# Patient Record
Sex: Female | Born: 1970 | Race: White | Hispanic: No | Marital: Married | Smoking: Former smoker
Health system: Southern US, Community
[De-identification: ages and names within clinical notes are randomized; demographics above are authoritative.]

## PROBLEM LIST (undated history)

## (undated) DIAGNOSIS — D649 Anemia, unspecified: Secondary | ICD-10-CM

## (undated) DIAGNOSIS — I10 Essential (primary) hypertension: Secondary | ICD-10-CM

## (undated) HISTORY — PX: COLONOSCOPY: SHX174

---

## 1999-07-15 ENCOUNTER — Emergency Department (HOSPITAL_COMMUNITY): Admission: EM | Admit: 1999-07-15 | Discharge: 1999-07-15 | Payer: Self-pay | Admitting: Emergency Medicine

## 1999-07-15 ENCOUNTER — Encounter: Payer: Self-pay | Admitting: Emergency Medicine

## 2001-06-10 ENCOUNTER — Inpatient Hospital Stay (HOSPITAL_COMMUNITY): Admission: AD | Admit: 2001-06-10 | Discharge: 2001-06-16 | Payer: Self-pay | Admitting: *Deleted

## 2001-06-13 ENCOUNTER — Encounter: Payer: Self-pay | Admitting: *Deleted

## 2001-06-17 ENCOUNTER — Encounter: Admission: RE | Admit: 2001-06-17 | Discharge: 2001-07-17 | Payer: Self-pay | Admitting: *Deleted

## 2001-07-18 ENCOUNTER — Encounter: Admission: RE | Admit: 2001-07-18 | Discharge: 2001-08-17 | Payer: Self-pay | Admitting: *Deleted

## 2004-06-09 ENCOUNTER — Ambulatory Visit: Payer: Self-pay | Admitting: Gastroenterology

## 2006-06-03 ENCOUNTER — Ambulatory Visit: Payer: Self-pay

## 2006-07-15 ENCOUNTER — Ambulatory Visit: Payer: Self-pay | Admitting: Internal Medicine

## 2010-07-28 ENCOUNTER — Ambulatory Visit: Payer: Self-pay | Admitting: Obstetrics and Gynecology

## 2011-09-20 ENCOUNTER — Encounter: Payer: Self-pay | Admitting: Maternal & Fetal Medicine

## 2012-01-12 ENCOUNTER — Observation Stay: Payer: Self-pay | Admitting: Obstetrics and Gynecology

## 2012-01-12 LAB — URINALYSIS, COMPLETE
Bacteria: NONE SEEN
Bilirubin,UR: NEGATIVE
Blood: NEGATIVE
Glucose,UR: NEGATIVE mg/dL (ref 0–75)
Ketone: NEGATIVE
Leukocyte Esterase: NEGATIVE
Nitrite: NEGATIVE
Ph: 8 (ref 4.5–8.0)
Protein: NEGATIVE
RBC,UR: NONE SEEN /HPF (ref 0–5)
Specific Gravity: 1.002 (ref 1.003–1.030)
Squamous Epithelial: <1
WBC UR: NONE SEEN /HPF (ref 0–5)

## 2012-02-03 ENCOUNTER — Inpatient Hospital Stay: Payer: Self-pay | Admitting: Obstetrics and Gynecology

## 2012-02-03 LAB — URINALYSIS, COMPLETE
Bacteria: NONE SEEN
Bilirubin,UR: NEGATIVE
Blood: NEGATIVE
Glucose,UR: NEGATIVE mg/dL (ref 0–75)
Ketone: NEGATIVE
Nitrite: NEGATIVE
Ph: 9 (ref 4.5–8.0)
Protein: NEGATIVE
RBC,UR: NONE SEEN /HPF (ref 0–5)
Specific Gravity: 1.006 (ref 1.003–1.030)
Squamous Epithelial: 5
WBC UR: NONE SEEN /HPF (ref 0–5)

## 2012-02-03 LAB — FETAL FIBRONECTIN
Appearance: NORMAL
Fetal Fibronectin: POSITIVE

## 2012-02-03 LAB — CBC WITH DIFFERENTIAL/PLATELET
Basophil #: 0 10*3/uL (ref 0.0–0.1)
Basophil %: 0.3 %
Eosinophil #: 0.1 10*3/uL (ref 0.0–0.7)
Eosinophil %: 0.6 %
HCT: 31.9 % — ABNORMAL LOW (ref 35.0–47.0)
HGB: 10.7 g/dL — ABNORMAL LOW (ref 12.0–16.0)
Lymphocyte #: 1.1 10*3/uL (ref 1.0–3.6)
Lymphocyte %: 8.9 %
MCH: 32.3 pg (ref 26.0–34.0)
MCHC: 33.7 g/dL (ref 32.0–36.0)
MCV: 96 fL (ref 80–100)
Monocyte #: 0.2 x10 3/mm (ref 0.2–0.9)
Monocyte %: 1.6 %
Neutrophil #: 10.8 10*3/uL — ABNORMAL HIGH (ref 1.4–6.5)
Neutrophil %: 88.6 %
Platelet: 252 10*3/uL (ref 150–440)
RBC: 3.32 10*6/uL — ABNORMAL LOW (ref 3.80–5.20)
RDW: 14 % (ref 11.5–14.5)
WBC: 12.2 10*3/uL — ABNORMAL HIGH (ref 3.6–11.0)

## 2012-02-04 LAB — URINE CULTURE

## 2012-02-05 LAB — LIPASE, BLOOD: Lipase: 77 U/L (ref 73–393)

## 2012-02-05 LAB — AMYLASE: Amylase: 41 U/L (ref 25–115)

## 2012-02-23 ENCOUNTER — Observation Stay: Payer: Self-pay | Admitting: Obstetrics and Gynecology

## 2012-02-23 LAB — AMYLASE: Amylase: 51 U/L (ref 25–115)

## 2012-02-23 LAB — CBC WITH DIFFERENTIAL/PLATELET
Basophil %: 0.6 %
Eosinophil #: 0.1 10*3/uL (ref 0.0–0.7)
Eosinophil %: 1.3 %
HCT: 31.3 % — ABNORMAL LOW (ref 35.0–47.0)
HGB: 10.6 g/dL — ABNORMAL LOW (ref 12.0–16.0)
MCH: 32.3 pg (ref 26.0–34.0)
MCV: 95 fL (ref 80–100)
Monocyte #: 0.8 x10 3/mm (ref 0.2–0.9)
Monocyte %: 7.4 %
Neutrophil #: 7.8 10*3/uL — ABNORMAL HIGH (ref 1.4–6.5)
Platelet: 270 10*3/uL (ref 150–440)
RBC: 3.29 10*6/uL — ABNORMAL LOW (ref 3.80–5.20)
RDW: 13.5 % (ref 11.5–14.5)
WBC: 10.5 10*3/uL (ref 3.6–11.0)

## 2012-02-23 LAB — COMPREHENSIVE METABOLIC PANEL
Albumin: 2.5 g/dL — ABNORMAL LOW (ref 3.4–5.0)
Alkaline Phosphatase: 128 U/L (ref 50–136)
Anion Gap: 9 (ref 7–16)
BUN: 3 mg/dL — ABNORMAL LOW (ref 7–18)
Calcium, Total: 8.3 mg/dL — ABNORMAL LOW (ref 8.5–10.1)
Chloride: 109 mmol/L — ABNORMAL HIGH (ref 98–107)
Co2: 25 mmol/L (ref 21–32)
EGFR (African American): >60
Glucose: 84 mg/dL (ref 65–99)
Osmolality: 281 (ref 275–301)
Potassium: 3.8 mmol/L (ref 3.5–5.1)
SGOT(AST): 19 U/L (ref 15–37)
Sodium: 143 mmol/L (ref 136–145)

## 2012-03-10 ENCOUNTER — Inpatient Hospital Stay: Payer: Self-pay | Admitting: Obstetrics and Gynecology

## 2012-03-10 LAB — CBC WITH DIFFERENTIAL/PLATELET
Basophil %: 0.6 %
Eosinophil %: 0.4 %
HGB: 10.7 g/dL — ABNORMAL LOW (ref 12.0–16.0)
MCH: 32.3 pg (ref 26.0–34.0)
Monocyte #: 0.8 x10 3/mm (ref 0.2–0.9)
Monocyte %: 6.5 %
Neutrophil #: 9.2 10*3/uL — ABNORMAL HIGH (ref 1.4–6.5)
Neutrophil %: 76.7 %
Platelet: 254 10*3/uL (ref 150–440)
RBC: 3.3 10*6/uL — ABNORMAL LOW (ref 3.80–5.20)
RDW: 13.6 % (ref 11.5–14.5)
WBC: 12 10*3/uL — ABNORMAL HIGH (ref 3.6–11.0)

## 2013-08-01 DIAGNOSIS — D239 Other benign neoplasm of skin, unspecified: Secondary | ICD-10-CM

## 2013-08-01 HISTORY — DX: Other benign neoplasm of skin, unspecified: D23.9

## 2014-03-08 ENCOUNTER — Ambulatory Visit: Payer: Self-pay | Admitting: Obstetrics and Gynecology

## 2014-09-10 NOTE — Discharge Summary (Signed)
PATIENT NAME:  Hall, Kathleen MR#:  [id number] DATE OF BIRTH:  03-17-1971  DATE OF ADMISSION:  02/03/2012 DATE OF DISCHARGE:  02/06/2012  REASON FOR ADMISSION: Preterm labor with history of PROM and preterm labor and delivery.   DISCHARGE DIAGNOSIS: Preterm labor with history of P-PROM and preterm labor and delivery.   PROCEDURE: IM betamethasone, IV magnesium sulfate, p.o. Procardia.   HOSPITAL COURSE: Patient was admitted at 30+ weeks with symptoms consistent with previous delivery. She has a history of P-PROM and no evidence of rupture on this admission. On admission she was noted that her cervix did change. She had increasing contractions. She had a positive fetal fibronectin which led [place] to provide IV magnesium sulfate. She was left on IV magnesium sulfate for 48 hours while betamethasone was given. She was transferred to the floor and appears stable and she was discharged home. Please refer to progress notes which were not available to me at this moment to assess plan from that point on.   ____________________________ Ricky L. Amalia Hailey, MD rle:cms D: 02/21/2012 10:40:57 ET T: 02/21/2012 10:52:24 ET JOB#: [id number]  cc: Ricky L. Amalia Hailey, MD, <Dictator> Selmer Dominion MD ELECTRONICALLY SIGNED 02/21/2012 13:22

## 2014-09-10 NOTE — Consult Note (Signed)
Maternal Age 44    Gravida 3    Para 1    Term Deliveries 0    Preterm Deliveries 1    Abortions 1    Living Children 1    Final EDD (dd-mmm-yy) 04-Feb-2012    Blood Type (Maternal) A positive    Antibody Screen Results (Maternal) negative    HIV Results (Maternal) unknown    Gonorrhea Results (Maternal) negative    Chlamydia Results (Maternal) negative    Hepatitis C Culture (Maternal) unknown    Herpes Results (Maternal) n/a    VDRL/RPR/Syphilis Results (Maternal) negative    Varicella Titer Results (Maternal) Unknown    Rubella Results (Maternal) immune    Hepatitis B Surface Antigen Results (Maternal) negative    Group B Strep Results Maternal (Result >5wks must be treated as unknown) unknown/result > 5 weeks ago    Prenatal Care Adequate     Additional Comments I met with Ms. Klemann per Dr.Evans's request for prenatal consult. She is a 76yo G2 mother with uncomplicated pregnancy and prior h/o preterm labor 10 yrs ago. I explained in detail about the length of hospital stay, hospital course and complications of prematurity in case baby is born in the next few days. Mother has received full course prenatal steroids. We discussed about sepsis evaluation, jaundice and feeding preoblems related to prematurity.   She acknowledged understanding and had no futher question.  I offered to come back if she were to have any questions.    Thank you for the consultation.   Electronic Signatures: Darreld Mclean (MD)  (Signed 13-Sep-13 15:41)  Authored: PREGNANCY and LABOR, ADDITIONAL COMMENTS   Last Updated: 13-Sep-13 15:41 by Darreld Mclean (MD)

## 2014-09-10 NOTE — Op Note (Signed)
PATIENT NAME:  Kathleen Hall, Kathleen Hall MR#:  [id number] DATE OF BIRTH:  02/06/1971  DATE OF PROCEDURE:  03/11/2012  PREOPERATIVE DIAGNOSES:  1. Prolonged second stage of labor. 2. Occiput transverse presentation.   POSTOPERATIVE DIAGNOSES: 1. Prolonged second stage of labor. 2. Occiput transverse presentation.   PROCEDURES:  1. Kjelland rotation forceps and delivery. 2. Repair of partial third degree perineal laceration.   INDICATIONS: This is a 44 year old gravida 2 para 1 patient labored and was pushing for three hours and was not bringing the fetal head past +2 stations. Examination revealed fetus to be in the right occiput transverse presentation. This was confirmed with a bedside ultrasound that documented the ROT presentation with the spine on the patient's right side. The patient was set up for a forcep delivery with Kjelland forceps. The indication and the procedure was explained to the patient and the husband who verbally agreed.  PROCEDURE: The Kjelland anterior blade was placed in the classic presentation underneath the symphysis pubis with good application. The posterior blade was then placed without difficulty. The forcep blades articulated without difficulty. Position of the blades reconfirmed with the digital exam. The fetal head was then rotated from an ROT position to a ROA position with a 90 degree rotation. Forceps were not disarticulated and remained applied to the fetal head while the patient pushed and the head was delivered in [facility] presentation. Forceps were then removed and the rest of the head was delivered. Shoulders were delivered aided by the patient in the McRoberts leg position and delivery of the shoulder and the rest of the body without difficulty. Vigorous female was then passed to Dr. Ave Filter who assigned Apgar scores of 8 and 9. Forcep markings with some bruising on the left sheath area. No orbital compression noted. Placenta was delivered without difficulty after obtaining  cord blood. A partial third-degree laceration was then noted with a deeper right vaginal posterior laceration noted. Partial third-degree capsule reapproximated with 2-0 Vicryl suture. The vaginal epithelium was closed with 2-0 Vicryl and crown suture placed with reapproximation of perineal body and the skin was reapproximated with subcuticular 3-0 Vicryl suture. Some swelling was noted. Rectal exam was performed before repair and after which both were normal. The patient tolerated the procedure well. Estimated blood loss 300 mL.   ____________________________ Boykin Nearing, MD tjs:drc D: 03/11/2012 10:24:20 ET T: 03/11/2012 11:12:39 ET JOB#: [id number]  cc: Boykin Nearing, MD, <Dictator> Boykin Nearing MD ELECTRONICALLY SIGNED 03/12/2012 17:53

## 2015-03-13 ENCOUNTER — Other Ambulatory Visit: Payer: Self-pay | Admitting: Obstetrics and Gynecology

## 2015-03-13 DIAGNOSIS — Z1231 Encounter for screening mammogram for malignant neoplasm of breast: Secondary | ICD-10-CM

## 2015-04-01 ENCOUNTER — Ambulatory Visit
Admission: RE | Admit: 2015-04-01 | Discharge: 2015-04-01 | Disposition: A | Discharge status: Home / Self Care | Payer: BLUE CROSS/BLUE SHIELD | Source: Ambulatory Visit | Attending: Obstetrics and Gynecology | Admitting: Obstetrics and Gynecology

## 2015-04-01 DIAGNOSIS — Z1231 Encounter for screening mammogram for malignant neoplasm of breast: Secondary | ICD-10-CM | POA: Insufficient documentation

## 2015-12-26 ENCOUNTER — Encounter: Payer: Self-pay | Admitting: Emergency Medicine

## 2015-12-26 ENCOUNTER — Emergency Department: Payer: BLUE CROSS/BLUE SHIELD

## 2015-12-26 ENCOUNTER — Emergency Department
Admission: EM | Admit: 2015-12-26 | Discharge: 2015-12-27 | Disposition: A | Discharge status: Home / Self Care | Payer: BLUE CROSS/BLUE SHIELD | Attending: Emergency Medicine | Admitting: Emergency Medicine

## 2015-12-26 DIAGNOSIS — Z87891 Personal history of nicotine dependence: Secondary | ICD-10-CM | POA: Insufficient documentation

## 2015-12-26 DIAGNOSIS — I10 Essential (primary) hypertension: Secondary | ICD-10-CM | POA: Diagnosis not present

## 2015-12-26 DIAGNOSIS — Z79899 Other long term (current) drug therapy: Secondary | ICD-10-CM | POA: Insufficient documentation

## 2015-12-26 DIAGNOSIS — R51 Headache: Secondary | ICD-10-CM | POA: Diagnosis present

## 2015-12-26 DIAGNOSIS — R079 Chest pain, unspecified: Secondary | ICD-10-CM

## 2015-12-26 DIAGNOSIS — R519 Headache, unspecified: Secondary | ICD-10-CM

## 2015-12-26 LAB — BASIC METABOLIC PANEL
Anion gap: 7 (ref 5–15)
BUN: 10 mg/dL (ref 6–20)
CHLORIDE: 105 mmol/L (ref 101–111)
CO2: 25 mmol/L (ref 22–32)
Calcium: 9.2 mg/dL (ref 8.9–10.3)
Creatinine, Ser: 0.77 mg/dL (ref 0.44–1.00)
GFR calc Af Amer: >60 mL/min (ref >60)
GFR calc non Af Amer: >60 mL/min (ref >60)
GLUCOSE: 110 mg/dL — AB (ref 65–99)
POTASSIUM: 4.2 mmol/L (ref 3.5–5.1)
SODIUM: 137 mmol/L (ref 135–145)

## 2015-12-26 LAB — CBC
HEMATOCRIT: 40.2 % (ref 35.0–47.0)
Hemoglobin: 13.6 g/dL (ref 12.0–16.0)
MCH: 32.5 pg (ref 26.0–34.0)
MCHC: 33.9 g/dL (ref 32.0–36.0)
MCV: 96.1 fL (ref 80.0–100.0)
Platelets: 276 10*3/uL (ref 150–440)
RBC: 4.19 MIL/uL (ref 3.80–5.20)
RDW: 13.1 % (ref 11.5–14.5)
WBC: 7.8 10*3/uL (ref 3.6–11.0)

## 2015-12-26 LAB — TROPONIN I: Troponin I: <0.03 ng/mL (ref <0.03)

## 2015-12-26 MED ORDER — ASPIRIN 81 MG PO CHEW
324.0000 mg | CHEWABLE_TABLET | Freq: Once | ORAL | Status: AC
  Administered 2015-12-26: 324 mg via ORAL
  Filled 2015-12-26: qty 4

## 2015-12-26 MED ORDER — NITROGLYCERIN 0.4 MG SL SUBL: 0.4000 mg | SUBLINGUAL_TABLET | SUBLINGUAL | Status: DC | PRN

## 2015-12-26 NOTE — ED Notes (Signed)
Patient states that her chest hurts more when she exhales rather than inspiration.  Patient appears in NAD at this time with even and unlabored respirations.

## 2015-12-26 NOTE — ED Provider Notes (Signed)
[facility] Emergency Department Provider Note   ____________________________________________   First MD Initiated Contact with Patient 12/26/15 2311     (approximate)  I have reviewed the triage vital signs and the nursing notes.   HISTORY  Chief Complaint Chest Pain    HPI Kathleen Hall is a 45 y.o. female who comes into the hospital today with some chest pain and elevated blood pressure. The patient reports that her chest felt tight in her arm was tingling. The patient reports that she was driving to the grocery store when the symptoms started. The patient reports that she went to [facility] and checked her blood pressure. She reports that it was 194/104 and then 184/98. The patient reports that she does not have a history of elevated blood pressure. The pharmacist told her that she should come in and get checked out. The patient denies any shortness of breath, sweats, dizziness, lightheadedness. She reports that her chest pain is improved and currently is a 2 out of 10 in intensity. She denies any nausea or vomiting. She reports that she does have a headache currently but has been having headaches for the past 2 weeks. The patient rates her head pain of 4-5 out of 10 in intensity. She does have a history of migraines but reports that she hasn't had one in a few years and that this doesn't feel like her migraines. Dull and achy and resolved with Advil and ibuprofen.The patient is here for evaluation.   History reviewed. No pertinent past medical history.  There are no active problems to display for this patient.   History reviewed. No pertinent surgical history.  Prior to Admission medications   Medication Sig Start Date End Date Taking? Authorizing Provider  levonorgestrel-ethinyl estradiol (SEASONALE,INTROVALE,JOLESSA) 0.15-0.03 MG tablet Take 1 tablet by mouth daily. 10/19/15  Yes Historical Provider, MD  polyethylene glycol (MIRALAX / GLYCOLAX) packet Take  17 g by mouth daily.   Yes Historical Provider, MD    Allergies Review of patient's allergies indicates no known allergies.  Family History  Problem Relation Age of Onset  . Breast cancer Neg Hx     Social History Social History  Substance Use Topics  . Smoking status: Former Research scientist (life sciences)  . Smokeless tobacco: Never Used  . Alcohol use Yes    Review of Systems Constitutional: No fever/chills Eyes: No visual changes. ENT: No sore throat. Cardiovascular:  chest pain. Respiratory: Denies shortness of breath. Gastrointestinal: No abdominal pain.  No nausea, no vomiting.  No diarrhea.  No constipation. Genitourinary: Negative for dysuria. Musculoskeletal: Negative for back pain. Skin: Negative for rash. Neurological: Left arm tingling and headache  10-point ROS otherwise negative.  ____________________________________________   PHYSICAL EXAM:  VITAL SIGNS: ED Triage Vitals [12/26/15 1948]  Enc Vitals Group     BP (!) 188/96     Pulse Rate 85     Resp 16     Temp 97.8 F (36.6 C)     Temp Source Oral     SpO2 100 %     Weight 155 lb (70.3 kg)     Height 5\' 1"  (1.549 m)     Head Circumference      Peak Flow      Pain Score 5     Pain Loc      Pain Edu?      Excl. in [place]?     Constitutional: Alert and oriented. Well appearing and in Mild distress. Eyes: Conjunctivae are normal. PERRL.  EOMI. Head: Atraumatic. Nose: No congestion/rhinnorhea. Mouth/Throat: Mucous membranes are moist.  Oropharynx non-erythematous. Cardiovascular: Normal rate, regular rhythm. Grossly normal heart sounds.  Good peripheral circulation. Respiratory: Normal respiratory effort.  No retractions. Lungs CTAB. Gastrointestinal: Soft and nontender. No distention. Positive bowel sounds Musculoskeletal: No lower extremity tenderness nor edema.   Neurologic:  Normal speech and language.  Skin:  Skin is warm, dry and intact.  Psychiatric: Mood and affect are normal.    ____________________________________________   LABS (all labs ordered are listed, but only abnormal results are displayed)  Labs Reviewed  BASIC METABOLIC PANEL - Abnormal; Notable for the following:       Result Value   Glucose, Bld 110 (*)    All other components within normal limits  CBC  TROPONIN I  TROPONIN I   ____________________________________________  EKG  ED ECG REPORT I, Loney Hering, the attending physician, personally viewed and interpreted this ECG.   Date: 12/26/2015  EKG Time: 1956  Rate: 86  Rhythm: normal sinus rhythm  Axis: Normal  Intervals:none  ST&T Change: None  ____________________________________________  RADIOLOGY  Chest x-ray ____________________________________________   PROCEDURES  Procedure(s) performed: None  Procedures  Critical Care performed: No  ____________________________________________   INITIAL IMPRESSION / ASSESSMENT AND PLAN / ED COURSE  Pertinent labs & imaging results that were available during my care of the patient were reviewed by me and considered in my medical decision making (see chart for details).  This is a 45 year old female who comes into the hospital today with some chest pain and arm tingling. The patient's blood pressure was elevated prior to coming into the emergency department. Her blood pressures improved at this time I will give her some aspirin as well as a dose of nitroglycerin. I will then check a repeat troponin. I will also check a CT of the patient's head given her chronic headaches for the past 2 weeks. The patient will be reassessed once I have received a repeat blood work as well as the imaging studies.  Clinical Course  Value Comment By Time  DG Chest 2 View The patient's chest x-ray shows no acute pulmonary process. Loney Hering, MD 08/04 2326  CT Head Wo Contrast Normal noncontrast head CT. Loney Hering, MD 08/05 [phone]   The patient's CT head is unremarkable and  her troponin is unremarkable. At this time I feel that the patient's pain may have been due to her blood pressure. She does not have a primary care physician so it is unknown if the patient has been having difficulty with her blood pressure. The patient's blood pressure currently though is improved. As the patient's troponin is negative I will discharge the patient to have her follow-up with the acute care clinic for further evaluation of her chest pain as well as her elevated blood pressure. Loney Hering, MD 08/05 [phone]   The patient will be discharged to home to follow-up with the acute care clinic. The patient understands and agrees with the plans as stated previously.  ____________________________________________   FINAL CLINICAL IMPRESSION(S) / ED DIAGNOSES  Final diagnoses:  Essential hypertension  Chest pain, unspecified chest pain type  Acute nonintractable headache, unspecified headache type      NEW MEDICATIONS STARTED DURING THIS VISIT:  New Prescriptions   No medications on file     Note:  This document was prepared using Dragon voice recognition software and may include unintentional dictation errors.    Loney Hering, MD 12/27/15 [place]

## 2015-12-26 NOTE — ED Triage Notes (Signed)
  Pt comes into the ED via POV c/o central and left sided chest pain that radiates into the left Arm.  Patient describes it as tightness and tingling sensation into the arm.  Patient also has elevated BP which is new for her.  Denies any N/V, diaphoresis, or lightheadedness.  Patient attempted to take Tums to make sur eit wasn't heart burn and she has had no relief.

## 2015-12-27 LAB — TROPONIN I: Troponin I: <0.03 ng/mL (ref <0.03)

## 2016-01-19 ENCOUNTER — Ambulatory Visit: Payer: BLUE CROSS/BLUE SHIELD | Admitting: Family Medicine

## 2016-01-19 DIAGNOSIS — Z0289 Encounter for other administrative examinations: Secondary | ICD-10-CM

## 2017-02-24 ENCOUNTER — Other Ambulatory Visit: Payer: Self-pay | Admitting: Physician Assistant

## 2017-02-24 DIAGNOSIS — Z1239 Encounter for other screening for malignant neoplasm of breast: Secondary | ICD-10-CM

## 2017-02-24 DIAGNOSIS — Z0000 Encounter for general adult medical examination without abnormal findings: Secondary | ICD-10-CM

## 2017-03-31 ENCOUNTER — Other Ambulatory Visit: Payer: Self-pay | Admitting: Physician Assistant

## 2017-03-31 DIAGNOSIS — M545 Low back pain: Secondary | ICD-10-CM

## 2017-04-27 ENCOUNTER — Ambulatory Visit: Payer: BLUE CROSS/BLUE SHIELD

## 2017-04-28 ENCOUNTER — Other Ambulatory Visit: Payer: Self-pay

## 2017-04-28 ENCOUNTER — Encounter
Admission: RE | Admit: 2017-04-28 | Discharge: 2017-04-28 | Disposition: A | Discharge status: Home / Self Care | Payer: BLUE CROSS/BLUE SHIELD | Source: Ambulatory Visit | Attending: Obstetrics & Gynecology | Admitting: Obstetrics & Gynecology

## 2017-04-28 HISTORY — DX: Essential (primary) hypertension: I10

## 2017-04-28 HISTORY — DX: Anemia, unspecified: D64.9

## 2017-05-02 ENCOUNTER — Encounter
Admission: RE | Admit: 2017-05-02 | Discharge: 2017-05-02 | Disposition: A | Discharge status: Home / Self Care | Payer: BLUE CROSS/BLUE SHIELD | Source: Ambulatory Visit | Attending: Obstetrics & Gynecology | Admitting: Obstetrics & Gynecology

## 2017-05-02 DIAGNOSIS — Z01818 Encounter for other preprocedural examination: Secondary | ICD-10-CM | POA: Insufficient documentation

## 2017-05-02 DIAGNOSIS — N83202 Unspecified ovarian cyst, left side: Secondary | ICD-10-CM | POA: Insufficient documentation

## 2017-05-02 DIAGNOSIS — I10 Essential (primary) hypertension: Secondary | ICD-10-CM | POA: Insufficient documentation

## 2017-05-02 DIAGNOSIS — R102 Pelvic and perineal pain: Secondary | ICD-10-CM | POA: Diagnosis not present

## 2017-05-02 LAB — CBC
HCT: 41.1 % (ref 35.0–47.0)
Hemoglobin: 13.9 g/dL (ref 12.0–16.0)
MCH: 32.4 pg (ref 26.0–34.0)
MCHC: 33.8 g/dL (ref 32.0–36.0)
MCV: 95.8 fL (ref 80.0–100.0)
PLATELETS: 239 10*3/uL (ref 150–440)
RBC: 4.29 MIL/uL (ref 3.80–5.20)
RDW: 12.6 % (ref 11.5–14.5)
WBC: 5.3 10*3/uL (ref 3.6–11.0)

## 2017-05-02 LAB — BASIC METABOLIC PANEL
ANION GAP: 10 (ref 5–15)
BUN: 17 mg/dL (ref 6–20)
CO2: 29 mmol/L (ref 22–32)
Calcium: 9.2 mg/dL (ref 8.9–10.3)
Chloride: 101 mmol/L (ref 101–111)
Creatinine, Ser: 0.86 mg/dL (ref 0.44–1.00)
Glucose, Bld: 91 mg/dL (ref 65–99)
Potassium: 3.5 mmol/L (ref 3.5–5.1)
Sodium: 140 mmol/L (ref 135–145)

## 2017-05-02 NOTE — Patient Instructions (Signed)
Your procedure is scheduled on: 05/06/17 Report to [facility]. To find out your arrival time please call [phone] between 1PM - 3PM on 05/05/17 Remember: Instructions that are not followed completely may result in serious medical risk, up to and including death, or upon the discretion of your surgeon and anesthesiologist your surgery may need to be rescheduled.     _X__ 1. Do not eat food after midnight the night before your procedure.                 No gum chewing or hard candies. You may drink clear liquids up to 2 hours                 before you are scheduled to arrive for your surgery- DO not drink clear                 liquids within 2 hours of the start of your surgery.                 Clear Liquids include:  water, apple juice without pulp, clear carbohydrate                 drink such as Clearfast of Gartorade, Black Coffee or Tea (Do not add                 anything to coffee or tea).     _X__ 2.  No Alcohol for 24 hours before or after surgery.   _X__ 3.  Do Not Smoke or use e-cigarettes For 24 Hours Prior to Your Surgery.                 Do not use any chewable tobacco products for at least 6 hours prior to                 surgery.  ____  4.  Bring all medications with you on the day of surgery if instructed.   ____  5.  Notify your doctor if there is any change in your medical condition      (cold, fever, infections).     Do not wear jewelry, make-up, hairpins, clips or nail polish. Do not wear lotions, powders, or perfumes. You may wear deodorant. Do not shave 48 hours prior to surgery. Men may shave face and neck. Do not bring valuables to the hospital.    [facility] is not responsible for any belongings or valuables.  Contacts, dentures or bridgework may not be worn into surgery. Leave your suitcase in the car. After surgery it may be brought to your room. For patients admitted to the hospital, discharge time is determined by  your treatment team.   Patients discharged the day of surgery will not be allowed to drive home.   Please read over the following fact sheets that you were given:             ____ Take these medicines the morning of surgery with A SIP OF WATER:    1.   2.   3.   4.  5.  6.  ____ Fleet Enema (as directed)   ____ Use CHG Soap as directed  ____ Use inhalers on the day of surgery  ____ Stop metformin 2 days prior to surgery    ____ Take 1/2 of usual insulin dose the night before surgery. No insulin the morning          of surgery.   ____ Stop  Coumadin/Plavix/aspirin on   ____ Stop Anti-inflammatories on   ____ Stop supplements until after surgery.    ____ Bring C-Pap to the hospital.   __X_ Stop Phentermine now, stop ibuprofen now

## 2017-05-06 ENCOUNTER — Ambulatory Visit
Admission: RE | Admit: 2017-05-06 | Discharge: 2017-05-06 | Disposition: A | Discharge status: Home / Self Care | Payer: BLUE CROSS/BLUE SHIELD | Source: Ambulatory Visit | Attending: Obstetrics & Gynecology | Admitting: Obstetrics & Gynecology

## 2017-05-06 ENCOUNTER — Encounter: Payer: Self-pay | Admitting: *Deleted

## 2017-05-06 ENCOUNTER — Ambulatory Visit: Payer: BLUE CROSS/BLUE SHIELD | Admitting: Anesthesiology

## 2017-05-06 ENCOUNTER — Encounter
Admission: RE | Disposition: A | Discharge status: Home / Self Care | Payer: Self-pay | Source: Ambulatory Visit | Attending: Obstetrics & Gynecology

## 2017-05-06 DIAGNOSIS — Z30432 Encounter for removal of intrauterine contraceptive device: Secondary | ICD-10-CM | POA: Diagnosis not present

## 2017-05-06 DIAGNOSIS — N711 Chronic inflammatory disease of uterus: Secondary | ICD-10-CM | POA: Insufficient documentation

## 2017-05-06 DIAGNOSIS — N83202 Unspecified ovarian cyst, left side: Secondary | ICD-10-CM | POA: Insufficient documentation

## 2017-05-06 DIAGNOSIS — N920 Excessive and frequent menstruation with regular cycle: Secondary | ICD-10-CM | POA: Insufficient documentation

## 2017-05-06 DIAGNOSIS — R102 Pelvic and perineal pain: Secondary | ICD-10-CM | POA: Insufficient documentation

## 2017-05-06 DIAGNOSIS — I10 Essential (primary) hypertension: Secondary | ICD-10-CM | POA: Insufficient documentation

## 2017-05-06 DIAGNOSIS — N838 Other noninflammatory disorders of ovary, fallopian tube and broad ligament: Secondary | ICD-10-CM | POA: Insufficient documentation

## 2017-05-06 DIAGNOSIS — N840 Polyp of corpus uteri: Secondary | ICD-10-CM | POA: Insufficient documentation

## 2017-05-06 DIAGNOSIS — N938 Other specified abnormal uterine and vaginal bleeding: Secondary | ICD-10-CM | POA: Insufficient documentation

## 2017-05-06 DIAGNOSIS — Z79899 Other long term (current) drug therapy: Secondary | ICD-10-CM | POA: Insufficient documentation

## 2017-05-06 DIAGNOSIS — Z87891 Personal history of nicotine dependence: Secondary | ICD-10-CM | POA: Diagnosis not present

## 2017-05-06 HISTORY — PX: LAPAROSCOPIC UNILATERAL SALPINGO OOPHERECTOMY: SHX5935

## 2017-05-06 HISTORY — PX: LAPAROSCOPIC BILATERAL SALPINGECTOMY: SHX5889

## 2017-05-06 HISTORY — PX: IUD REMOVAL: SHX5392

## 2017-05-06 HISTORY — PX: HYSTEROSCOPY WITH NOVASURE: SHX5574

## 2017-05-06 LAB — ABO/RH: ABO/RH(D): A POS

## 2017-05-06 LAB — POCT PREGNANCY, URINE: PREG TEST UR: NEGATIVE

## 2017-05-06 SURGERY — SALPINGECTOMY, BILATERAL, LAPAROSCOPIC
Anesthesia: General

## 2017-05-06 MED ORDER — LIDOCAINE HCL (CARDIAC) 20 MG/ML IV SOLN
INTRAVENOUS | Status: DC | PRN
  Administered 2017-05-06: 40 mg via INTRAVENOUS

## 2017-05-06 MED ORDER — SILVER NITRATE-POT NITRATE 75-25 % EX MISC
CUTANEOUS | Status: AC
  Filled 2017-05-06: qty 2

## 2017-05-06 MED ORDER — OXYCODONE HCL 5 MG PO TABS
5.0000 mg | ORAL_TABLET | ORAL | Status: DC | PRN
  Administered 2017-05-06: 5 mg via ORAL

## 2017-05-06 MED ORDER — ONDANSETRON HCL 4 MG/2ML IJ SOLN: 4.0000 mg | Freq: Once | INTRAMUSCULAR | Status: DC | PRN

## 2017-05-06 MED ORDER — PROPOFOL 10 MG/ML IV BOLUS
INTRAVENOUS | Status: DC | PRN
  Administered 2017-05-06: 140 mg via INTRAVENOUS

## 2017-05-06 MED ORDER — HEPARIN SODIUM (PORCINE) 5000 UNIT/ML IJ SOLN
5000.0000 [IU] | Freq: Once | INTRAMUSCULAR | Status: AC
  Administered 2017-05-06: 5000 [IU] via SUBCUTANEOUS

## 2017-05-06 MED ORDER — SUGAMMADEX SODIUM 200 MG/2ML IV SOLN
INTRAVENOUS | Status: AC
  Filled 2017-05-06: qty 2

## 2017-05-06 MED ORDER — KETOROLAC TROMETHAMINE 30 MG/ML IJ SOLN
30.0000 mg | Freq: Four times a day (QID) | INTRAMUSCULAR | Status: DC
  Filled 2017-05-06 (×6): qty 1

## 2017-05-06 MED ORDER — FAMOTIDINE 20 MG PO TABS
20.0000 mg | ORAL_TABLET | Freq: Once | ORAL | Status: AC
  Administered 2017-05-06: 20 mg via ORAL

## 2017-05-06 MED ORDER — ACETAMINOPHEN 500 MG PO TABS
ORAL_TABLET | ORAL | Status: AC
  Administered 2017-05-06: 1000 mg via ORAL
  Filled 2017-05-06: qty 2

## 2017-05-06 MED ORDER — CELECOXIB 200 MG PO CAPS
ORAL_CAPSULE | ORAL | Status: AC
  Administered 2017-05-06: 400 mg via ORAL
  Filled 2017-05-06: qty 2

## 2017-05-06 MED ORDER — PROPOFOL 10 MG/ML IV BOLUS
INTRAVENOUS | Status: AC
  Filled 2017-05-06: qty 20

## 2017-05-06 MED ORDER — ROCURONIUM BROMIDE 100 MG/10ML IV SOLN
INTRAVENOUS | Status: DC | PRN
  Administered 2017-05-06: 40 mg via INTRAVENOUS
  Administered 2017-05-06 (×2): 10 mg via INTRAVENOUS

## 2017-05-06 MED ORDER — MIDAZOLAM HCL 2 MG/2ML IJ SOLN
INTRAMUSCULAR | Status: AC
  Filled 2017-05-06: qty 2

## 2017-05-06 MED ORDER — ONDANSETRON HCL 4 MG/2ML IJ SOLN
INTRAMUSCULAR | Status: DC | PRN
  Administered 2017-05-06: 4 mg via INTRAVENOUS

## 2017-05-06 MED ORDER — LACTATED RINGERS IV SOLN: INTRAVENOUS | Status: DC

## 2017-05-06 MED ORDER — FENTANYL CITRATE (PF) 100 MCG/2ML IJ SOLN
INTRAMUSCULAR | Status: DC | PRN
  Administered 2017-05-06 (×2): 50 ug via INTRAVENOUS

## 2017-05-06 MED ORDER — FENTANYL CITRATE (PF) 100 MCG/2ML IJ SOLN
INTRAMUSCULAR | Status: AC
  Filled 2017-05-06: qty 2

## 2017-05-06 MED ORDER — ROCURONIUM BROMIDE 50 MG/5ML IV SOLN
INTRAVENOUS | Status: AC
  Filled 2017-05-06: qty 1

## 2017-05-06 MED ORDER — DEXAMETHASONE SODIUM PHOSPHATE 10 MG/ML IJ SOLN
INTRAMUSCULAR | Status: DC | PRN
  Administered 2017-05-06: 10 mg via INTRAVENOUS

## 2017-05-06 MED ORDER — ACETAMINOPHEN 500 MG PO TABS
1000.0000 mg | ORAL_TABLET | Freq: Once | ORAL | Status: AC
  Administered 2017-05-06: 1000 mg via ORAL

## 2017-05-06 MED ORDER — DEXAMETHASONE SODIUM PHOSPHATE 10 MG/ML IJ SOLN
INTRAMUSCULAR | Status: AC
  Filled 2017-05-06: qty 1

## 2017-05-06 MED ORDER — FENTANYL CITRATE (PF) 100 MCG/2ML IJ SOLN: 25.0000 ug | INTRAMUSCULAR | Status: DC | PRN

## 2017-05-06 MED ORDER — HEPARIN SODIUM (PORCINE) 5000 UNIT/ML IJ SOLN
INTRAMUSCULAR | Status: AC
  Administered 2017-05-06: 5000 [IU] via SUBCUTANEOUS
  Filled 2017-05-06: qty 1

## 2017-05-06 MED ORDER — MIDAZOLAM HCL 2 MG/2ML IJ SOLN
INTRAMUSCULAR | Status: DC | PRN
  Administered 2017-05-06: 2 mg via INTRAVENOUS

## 2017-05-06 MED ORDER — SILVER NITRATE-POT NITRATE 75-25 % EX MISC
CUTANEOUS | Status: AC
  Filled 2017-05-06: qty 3

## 2017-05-06 MED ORDER — PHENYLEPHRINE HCL 10 MG/ML IJ SOLN
INTRAMUSCULAR | Status: DC | PRN
  Administered 2017-05-06: 100 ug via INTRAVENOUS
  Administered 2017-05-06: 200 ug via INTRAVENOUS
  Administered 2017-05-06 (×2): 100 ug via INTRAVENOUS

## 2017-05-06 MED ORDER — LACTATED RINGERS IV SOLN
INTRAVENOUS | Status: DC
  Administered 2017-05-06 (×2): via INTRAVENOUS

## 2017-05-06 MED ORDER — CELECOXIB 200 MG PO CAPS
400.0000 mg | ORAL_CAPSULE | Freq: Once | ORAL | Status: AC
  Administered 2017-05-06: 400 mg via ORAL

## 2017-05-06 MED ORDER — ONDANSETRON HCL 4 MG/2ML IJ SOLN
INTRAMUSCULAR | Status: AC
  Filled 2017-05-06: qty 2

## 2017-05-06 MED ORDER — GABAPENTIN 300 MG PO CAPS
ORAL_CAPSULE | ORAL | Status: AC
  Administered 2017-05-06: 600 mg via ORAL
  Filled 2017-05-06: qty 2

## 2017-05-06 MED ORDER — GABAPENTIN 300 MG PO CAPS
600.0000 mg | ORAL_CAPSULE | Freq: Once | ORAL | Status: AC
  Administered 2017-05-06: 600 mg via ORAL

## 2017-05-06 MED ORDER — MORPHINE SULFATE (PF) 4 MG/ML IV SOLN: 1.0000 mg | INTRAVENOUS | Status: DC | PRN

## 2017-05-06 MED ORDER — SUGAMMADEX SODIUM 200 MG/2ML IV SOLN
INTRAVENOUS | Status: DC | PRN
  Administered 2017-05-06: 125.2 mg via INTRAVENOUS

## 2017-05-06 MED ORDER — FAMOTIDINE 20 MG PO TABS
ORAL_TABLET | ORAL | Status: AC
  Administered 2017-05-06: 20 mg via ORAL
  Filled 2017-05-06: qty 1

## 2017-05-06 MED ORDER — OXYCODONE HCL 5 MG PO TABS
ORAL_TABLET | ORAL | Status: AC
  Filled 2017-05-06: qty 1

## 2017-05-06 SURGICAL SUPPLY — 58 items
ADH SKN CLS APL DERMABOND .7 (GAUZE/BANDAGES/DRESSINGS) ×2
BAG SPEC RTRVL LRG 6X4 10 (ENDOMECHANICALS)
BAG URINE DRAINAGE (UROLOGICAL SUPPLIES) ×4 IMPLANT
BLADE SURG SZ11 CARB STEEL (BLADE) ×4 IMPLANT
CANISTER SUCT 1200ML W/VALVE (MISCELLANEOUS) ×4 IMPLANT
CANISTER SUCT 3000ML PPV (MISCELLANEOUS) ×4 IMPLANT
CATH FOLEY 2WAY  5CC 16FR (CATHETERS)
CATH FOLEY 2WAY 5CC 16FR (CATHETERS)
CATH ROBINSON RED A/P 16FR (CATHETERS) ×4 IMPLANT
CATH URTH 16FR FL 2W BLN LF (CATHETERS) ×2 IMPLANT
CHLORAPREP W/TINT 26ML (MISCELLANEOUS) ×6 IMPLANT
DERMABOND ADVANCED (GAUZE/BANDAGES/DRESSINGS) ×2
DERMABOND ADVANCED .7 DNX12 (GAUZE/BANDAGES/DRESSINGS) IMPLANT
DRAPE LEGGINS SURG 28X43 STRL (DRAPES) ×4 IMPLANT
DRAPE UNDER BUTTOCK W/FLU (DRAPES) ×4 IMPLANT
GLOVE BIO SURGEON STRL SZ 6.5 (GLOVE) ×2 IMPLANT
GLOVE BIO SURGEONS STRL SZ 6.5 (GLOVE) ×2
GLOVE PI ORTHOPRO 6.5 (GLOVE) ×2
GLOVE PI ORTHOPRO STRL 6.5 (GLOVE) ×2 IMPLANT
GLOVE SURG SYN 6.5 ES PF (GLOVE) ×4 IMPLANT
GLOVE SURG SYN 6.5 PF PI (GLOVE) ×2 IMPLANT
GOWN STRL REUS W/ TWL LRG LVL3 (GOWN DISPOSABLE) ×4 IMPLANT
GOWN STRL REUS W/ TWL XL LVL3 (GOWN DISPOSABLE) ×2 IMPLANT
GOWN STRL REUS W/TWL LRG LVL3 (GOWN DISPOSABLE) ×8
GOWN STRL REUS W/TWL XL LVL3 (GOWN DISPOSABLE) ×4
GRASPER SUT TROCAR 14GX15 (MISCELLANEOUS) ×2 IMPLANT
IRRIGATION STRYKERFLOW (MISCELLANEOUS) IMPLANT
IRRIGATOR STRYKERFLOW (MISCELLANEOUS) ×4
IV LACTATED RINGERS 1000ML (IV SOLUTION) ×4 IMPLANT
KIT PINK PAD W/HEAD ARE REST (MISCELLANEOUS) ×4
KIT PINK PAD W/HEAD ARM REST (MISCELLANEOUS) ×2 IMPLANT
KIT RM TURNOVER CYSTO AR (KITS) ×4 IMPLANT
LABEL OR SOLS (LABEL) IMPLANT
LIGASURE VESSEL 5MM BLUNT TIP (ELECTROSURGICAL) ×2 IMPLANT
MANIPULATOR UTERINE 4.5 ZUMI (MISCELLANEOUS) ×2 IMPLANT
NOVASURE ENDOMETRIAL ABLATION (MISCELLANEOUS) ×4 IMPLANT
NS IRRIG 500ML POUR BTL (IV SOLUTION) ×4 IMPLANT
PACK DNC HYST (MISCELLANEOUS) ×4 IMPLANT
PACK LAP CHOLECYSTECTOMY (MISCELLANEOUS) ×4 IMPLANT
PAD OB MATERNITY 4.3X12.25 (PERSONAL CARE ITEMS) ×4 IMPLANT
PAD PREP 24X41 OB/GYN DISP (PERSONAL CARE ITEMS) ×4 IMPLANT
PENCIL ELECTRO HAND CTR (MISCELLANEOUS) ×2 IMPLANT
POUCH SPECIMEN RETRIEVAL 10MM (ENDOMECHANICALS) IMPLANT
SCISSORS METZENBAUM CVD 33 (INSTRUMENTS) IMPLANT
SLEEVE ENDOPATH XCEL 5M (ENDOMECHANICALS) ×6 IMPLANT
SUT MNCRL 4-0 (SUTURE) ×4
SUT MNCRL 4-0 27XMFL (SUTURE) ×2
SUT MNCRL AB 4-0 PS2 18 (SUTURE) ×4 IMPLANT
SUT VIC AB 0 CT1 36 (SUTURE) ×2 IMPLANT
SUT VIC AB 2-0 UR6 27 (SUTURE) IMPLANT
SUTURE MNCRL 4-0 27XMF (SUTURE) ×2 IMPLANT
SYR 10ML LL (SYRINGE) ×4 IMPLANT
TOWEL OR 17X26 4PK STRL BLUE (TOWEL DISPOSABLE) ×4 IMPLANT
TROCAR ENDO BLADELESS 11MM (ENDOMECHANICALS) ×2 IMPLANT
TROCAR XCEL NON-BLD 5MMX100MML (ENDOMECHANICALS) ×4 IMPLANT
TUBING CONNECTING 10 (TUBING) ×3 IMPLANT
TUBING CONNECTING 10' (TUBING) ×1
TUBING INSUFFLATION (TUBING) ×4 IMPLANT

## 2017-05-06 NOTE — Anesthesia Postprocedure Evaluation (Signed)
Anesthesia Post Note  Patient: TABRIA STEINES  Procedure(s) Performed: LAPAROSCOPIC BILATERAL SALPINGECTOMY (Bilateral ) LAPAROSCOPIC UNILATERAL SALPINGO OOPHORECTOMY (N/A ) HYSTEROSCOPY WITH NOVASURE (N/A ) INTRAUTERINE DEVICE (IUD) REMOVAL (N/A )  Patient location during evaluation: PACU Anesthesia Type: General Level of consciousness: awake and alert and oriented Pain management: pain level controlled Vital Signs Assessment: post-procedure vital signs reviewed and stable Respiratory status: spontaneous breathing Cardiovascular status: blood pressure returned to baseline Anesthetic complications: no     Last Vitals:  Vitals:   05/06/17 1724 05/06/17 1821  BP: 115/73 120/80  Pulse: (!) 48 81  Resp: 15 16  Temp: (!) 36 C   SpO2: 100% 98%    Last Pain:  Vitals:   05/06/17 1821  TempSrc:   PainSc: 7                  Issiac Jamar

## 2017-05-06 NOTE — Anesthesia Preprocedure Evaluation (Signed)
Anesthesia Evaluation  Patient identified by MRN, date of birth, ID band Patient awake    Reviewed: Allergy & Precautions, H&P , NPO status , Patient's Chart, lab work & pertinent test results, reviewed documented beta blocker date and time   Airway Mallampati: II  TM Distance: >3 FB Neck ROM: full    Dental  (+) Teeth Intact   Pulmonary neg pulmonary ROS, former smoker,    Pulmonary exam normal        Cardiovascular Exercise Tolerance: Good hypertension, On Medications negative cardio ROS Normal cardiovascular exam Rhythm:regular Rate:Normal     Neuro/Psych negative neurological ROS  negative psych ROS   GI/Hepatic negative GI ROS, Neg liver ROS,   Endo/Other  negative endocrine ROS  Renal/GU negative Renal ROS  negative genitourinary   Musculoskeletal   Abdominal   Peds  Hematology negative hematology ROS (+) anemia ,   Anesthesia Other Findings Past Medical History: No date: Anemia No date: Hypertension Past Surgical History: No date: COLONOSCOPY   Reproductive/Obstetrics negative OB ROS                             Anesthesia Physical Anesthesia Plan  ASA: II  Anesthesia Plan: General ETT   Post-op Pain Management:    Induction:   PONV Risk Score and Plan:   Airway Management Planned:   Additional Equipment:   Intra-op Plan:   Post-operative Plan:   Informed Consent:   Dental Advisory Given  Plan Discussed with: CRNA  Anesthesia Plan Comments:         Anesthesia Quick Evaluation

## 2017-05-06 NOTE — Op Note (Signed)
Kathleen Hall PROCEDURE DATE: 05/06/2017  PATIENT:  Kathleen Hall  46 y.o. female  PRE-OPERATIVE DIAGNOSIS:  Dysfunctional Uterine Bleeding, Ovarian Cyst, Pelvic Pain  POST-OPERATIVE DIAGNOSIS:  Dysfunctional Uterine Bleeding, Ovarian Cyst, Pelvic Pain  PROCEDURE:  Procedure(s): LAPAROSCOPIC BILATERAL SALPINGECTOMY (Bilateral) LAPAROSCOPIC UNILATERAL SALPINGO OOPHORECTOMY (N/A) HYSTEROSCOPY WITH NOVASURE (N/A) INTRAUTERINE DEVICE (IUD) REMOVAL (N/A)  LYSIS OF ADHESIONS  SURGEON:  Surgeon(s) and Role:    * Ward, Honor Loh, MD - Primary  ANESTHESIA:  General via ET  I/O  Total I/O In: 1000 [I.V.:1000] Out: 25 [Blood:25]  FINDINGS:  Small uterus.  Normal upper abdomen.  Dilated and redundant colon, from cecum to rectosigmoid.  Sigmoid colon was largely adherent to pelvic sidewall, more than physiologic adhesions.  Left ovary had a small apical cyst.  Normal bilateral tubes normal right ovary.  SPECIMEN:  1. Left tube and ovary, right tube 2. Endometrial curettings/biopsy  COMPLICATIONS: none apparent  DISPOSITION: vital signs stable to PACU   Indication for Surgery: 46 y.o. with menorrhagia, unsatisfactorily resolved with IUD, chronic left side/hip pain.  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  Risks of surgery were discussed with the patient including but not limited to: bleeding which may require transfusion or reoperation; infection which may require antibiotics; injury to bowel, bladder, ureters or other surrounding organs; need for additional procedures including laparotomy, blood clot, incisional problems and other postoperative/anesthesia complications. Written informed consent was obtained.     PROCEDURE IN DETAIL:  The patient had sequential compression devices applied to her lower extremities while in the preoperative area.  She was then taken to the operating room where general anesthesia was administered and was found to be adequate.  She was placed in the dorsal  lithotomy position, and was prepped and draped in a sterile manner.  A Foley catheter was inserted into her bladder and attached to constant drainage and a uterine manipulator was then advanced into the uterus .  After a surgical timeout was performed, attention was turned to the abdomen where an umbilical incision was made with the scalpel. A 5mm trochar was inserted in the umbilical incision using a visiport method. Opening pressure was 3MMhG, and the abdomen was insufflated to 59mmHg carbon dioxide gas and adequate pneumoperitoneum was obtained.  A survey of the patient's pelvis and abdomen revealed the findings as mentioned above. Two 63mm ports were inserted in the lower left and right quadrants under visualization, and the 61mm umbilical trochar was changed to an 62mm trochar.    The sigmoid colon was draped over the left adnexa and attached predominantly to the pelvic sidewall at the brim as well as the abdominal sidewall caudally.  The Bovie hook and LigaSure were used to divide the peritoneum and reduce this adhesion medially, to free up the bowel.  In doing this the adnexa was revealed.  And although grossly normal there was a small cyst on the left ovary.  The retroperitoneum was exposed and the ureter seen vermiculating.  A window was created in the medial broad ligament and the IP ligament was skeletonized thrice cauterized and divided with the LigaSure.  The utero-ovarian ligament was also sealed and divided as well as the proximal end of the fallopian tube on the left.  The tissues were placed in the posterior cul-de-sac. The mesosalpinx of the right fallopian tube was sealed and divided stepwise from cornua to the fimbriated end.  Both of the specimens were placed into an Endo Catch bag and removed without difficulty from the abdomen.  At the bilateral ureters were seen from regulating after the procedure.  The operative site was surveyed, and it was found to be hemostatic.  No intraoperative  injury to surrounding organs was noted.  The abdomen was desufflated and all instruments were then removed from the patient's abdomen.  The inlet closure device was used to close the fascia of the umbilical incision with 0 Vicryl.  All skin incisions were closed with 4-0 monocryl and covered with surgical glue.   The attention was then turned to the vagina where a speculum was placed a single-tooth tenaculum placed at 12:00 on the cervix and a ring forceps was used to remove the IUD.  The uterus sounded to 7.5 cm.  The cervix was serially dilated to accommodate the hysteroscope which was inserted and evaluation of the uterus showed fluffy endometrium.  The cervix was measured to be 3.5 cm.  With the uterine cavity 4.0 cm the instruments were removed and the NovaSure device placed the width of the cavity was measured to be 3.2 cm.  all of this information was inputted and at power 79, the device was deployed for 1 minute and 48 seconds.  The instrument was removed the hysteroscope reinserted to confirm, cauterization of all endometrial surfaces.  All instruments were then removed; the tenaculum site was hemostatic.  The patient tolerated the procedures well.  All instruments, needles, and sponge counts were correct x 2. The patient was taken to the recovery room in stable condition.   ---- Larey Days, MD Attending Obstetrician and [facility]

## 2017-05-06 NOTE — Anesthesia Procedure Notes (Signed)
Date/Time: 05/06/2017 4:48 PM Performed by: Allean Found, CRNA

## 2017-05-06 NOTE — OR Nursing (Signed)
Patient able to mobilize prior to discharge. Some relief from pain medicine but still uncomfortable at a 5

## 2017-05-06 NOTE — Anesthesia Post-op Follow-up Note (Signed)
Anesthesia QCDR form completed.        

## 2017-05-06 NOTE — H&P (Signed)
Preoperative History and Physical  Kathleen Hall is a 46 y.o.  With acute on chronic pelvic pain, and menorrhagia.   No significant preoperative concerns.  Proposed surgery:  Laparoscopic bilateral salpingectomy, unilateral oophorectomy Endometrial ablation  Past Medical History:  Diagnosis Date  . Anemia   . Hypertension    Past Surgical History:  Procedure Laterality Date  . COLONOSCOPY     OB History  No data available  Patient denies any other pertinent gynecologic issues.   No current facility-administered medications on file prior to encounter.    Current Outpatient Medications on File Prior to Encounter  Medication Sig Dispense Refill  . fluticasone (FLONASE) 50 MCG/ACT nasal spray Place 2 sprays into both nostrils daily as needed for allergies or congestion.  5  . hydrochlorothiazide (HYDRODIURIL) 25 MG tablet Take 25 mg by mouth daily.    Marland Kitchen ibuprofen (ADVIL,MOTRIN) 200 MG tablet Take 400 mg by mouth every 8 (eight) hours as needed (for pain.).    Marland Kitchen Polyethyl Glycol-Propyl Glycol (LUBRICANT EYE DROPS) 0.4-0.3 % SOLN Place 1-2 drops into both eyes 3 (three) times daily as needed (for dry/irritated eyes).    . polyethylene glycol (MIRALAX / GLYCOLAX) packet Take 17 g by mouth daily. Mix with coffee.     No Known Allergies  Social History:   reports that she has quit smoking. she has never used smokeless tobacco. She reports that she drinks alcohol. She reports that she does not use drugs.  Family History  Problem Relation Age of Onset  . Breast cancer Neg Hx     Review of Systems: Noncontributory  PHYSICAL EXAM: Last menstrual period 04/07/2017. General appearance - alert, well appearing, and in no distress Chest - clear to auscultation, no wheezes, rales or rhonchi, symmetric air entry Heart - normal rate and regular rhythm Abdomen - soft, nontender, nondistended, no masses or organomegaly Pelvic exam: from office visit: External: Tanner stage 5, normal  female genitalia for age without lesions or masses, no inguinal lymphadenopathy Bladder: Normal size without masses or tenderness, well-supported Urethra: No lesions or discharge with palpation. Normal urethral size and location, no prolapse, no meatal caruncle, no diverticulum Vagina: normal physiological discharge, without lesions or masses, no abnormal support structures Cervix: no lesions or masses, no CMT, no discharge. IUD strings visible. Slight pressure with movement. Adnexa: exquisitely tender on the left side, on right pain radiates to the left as well. 3cm mass on the left, mobile, and reproducible pain.  Uterus: Normal size, anteverted position without masses or tenderness, mobile, non-tender, smooth contour, slight pressure with movement Anus/Perineum: Normal external exam, hemorrhoids present but not engorged Extremities - peripheral pulses normal, no pedal edema, no clubbing or cyanosis  Labs: Results for orders placed or performed during the hospital encounter of 05/02/17 (from the past 336 hour(s))  Basic metabolic panel   Collection Time: 05/02/17 11:54 AM  Result Value Ref Range   Sodium 140 135 - 145 mmol/L   Potassium 3.5 3.5 - 5.1 mmol/L   Chloride 101 101 - 111 mmol/L   CO2 29 22 - 32 mmol/L   Glucose, Bld 91 65 - 99 mg/dL   BUN 17 6 - 20 mg/dL   Creatinine, Ser 0.86 0.44 - 1.00 mg/dL   Calcium 9.2 8.9 - 10.3 mg/dL   GFR calc non Af Amer >60 >60 mL/min   GFR calc Af Amer >60 >60 mL/min   Anion gap 10 5 - 15  CBC   Collection Time: 05/02/17 11:54 AM  Result Value Ref Range   WBC 5.3 3.6 - 11.0 K/uL   RBC 4.29 3.80 - 5.20 MIL/uL   Hemoglobin 13.9 12.0 - 16.0 g/dL   HCT 41.1 35.0 - 47.0 %   MCV 95.8 80.0 - 100.0 fL   MCH 32.4 26.0 - 34.0 pg   MCHC 33.8 32.0 - 36.0 g/dL   RDW 12.6 11.5 - 14.5 %   Platelets 239 150 - 440 K/uL  Type and screen [facility]   Collection Time: 05/02/17 11:54 AM  Result Value Ref Range   ABO/RH(D) A POS     Antibody Screen NEG    Sample Expiration 05/16/2017    Extend sample reason NO TRANSFUSIONS OR PREGNANCY IN THE PAST 3 MONTHS      Plan: Patient will undergo surgical management with laparoscopic salpingectomy and unilateral oophorectomy (likely left) and endometrial ablation.   The risks of surgery were discussed in detail with the patient including but not limited to: bleeding which may require transfusion or reoperation; infection which may require antibiotics; injury to surrounding organs which may involve bowel, bladder, ureters ; need for additional procedures including laparoscopy or laparotomy; thromboembolic phenomenon, surgical site problems and other postoperative/anesthesia complications. Likelihood of success in alleviating the patient's condition was discussed. Routine postoperative instructions will be reviewed with the patient and her family in detail after surgery.  The patient concurred with the proposed plan, giving informed written consent for the surgery.  Patient has been NPO since last night she will remain NPO for procedure.  Anesthesia and OR aware.  To OR when ready.  ----- Larey Days, MD Attending Obstetrician and Gynecologist [facility], Department of [facility]

## 2017-05-06 NOTE — Transfer of Care (Signed)
Immediate Anesthesia Transfer of Care Note  Patient: Kathleen Hall  Procedure(s) Performed: LAPAROSCOPIC BILATERAL SALPINGECTOMY (Bilateral ) LAPAROSCOPIC UNILATERAL SALPINGO OOPHORECTOMY (N/A ) HYSTEROSCOPY WITH NOVASURE (N/A ) INTRAUTERINE DEVICE (IUD) REMOVAL (N/A )  Patient Location: PACU  Anesthesia Type:General  Level of Consciousness: sedated  Airway & Oxygen Therapy: Patient Spontanous Breathing and Patient connected to face mask oxygen  Post-op Assessment: Report given to RN and Post -op Vital signs reviewed and stable  Post vital signs: Reviewed and stable  Last Vitals:  Vitals:   05/06/17 1310 05/06/17 1642  BP: 136/87 125/83  Pulse: 92 84  Resp: 20 16  Temp: 36.9 C 36.7 C  SpO2: 100% 100%    Last Pain:  Vitals:   05/06/17 1310  TempSrc: Oral  PainSc: 2          Complications: No apparent anesthesia complications

## 2017-05-07 ENCOUNTER — Encounter: Payer: Self-pay | Admitting: Obstetrics & Gynecology

## 2017-05-09 LAB — TYPE AND SCREEN
ABO/RH(D): A POS
Antibody Screen: NEGATIVE

## 2017-05-10 LAB — SURGICAL PATHOLOGY

## 2018-02-07 ENCOUNTER — Other Ambulatory Visit: Payer: Self-pay | Admitting: Physician Assistant

## 2018-02-07 DIAGNOSIS — R1011 Right upper quadrant pain: Secondary | ICD-10-CM

## 2018-02-07 DIAGNOSIS — M545 Low back pain: Secondary | ICD-10-CM

## 2018-02-09 ENCOUNTER — Ambulatory Visit
Admission: RE | Admit: 2018-02-09 | Discharge: 2018-02-09 | Disposition: A | Discharge status: Home / Self Care | Payer: BLUE CROSS/BLUE SHIELD | Source: Ambulatory Visit | Attending: Physician Assistant | Admitting: Physician Assistant

## 2018-02-09 DIAGNOSIS — K8020 Calculus of gallbladder without cholecystitis without obstruction: Secondary | ICD-10-CM | POA: Insufficient documentation

## 2018-02-09 DIAGNOSIS — M5440 Lumbago with sciatica, unspecified side: Secondary | ICD-10-CM | POA: Insufficient documentation

## 2018-02-09 DIAGNOSIS — R1011 Right upper quadrant pain: Secondary | ICD-10-CM | POA: Insufficient documentation

## 2018-02-09 DIAGNOSIS — M545 Low back pain: Secondary | ICD-10-CM

## 2018-02-17 ENCOUNTER — Encounter
Admission: EM | Disposition: A | Discharge status: Home / Self Care | Payer: Self-pay | Source: Home / Self Care | Attending: Emergency Medicine

## 2018-02-17 ENCOUNTER — Observation Stay: Payer: BLUE CROSS/BLUE SHIELD | Admitting: Anesthesiology

## 2018-02-17 ENCOUNTER — Emergency Department: Payer: BLUE CROSS/BLUE SHIELD

## 2018-02-17 ENCOUNTER — Other Ambulatory Visit: Payer: Self-pay

## 2018-02-17 ENCOUNTER — Observation Stay
Admission: EM | Admit: 2018-02-17 | Discharge: 2018-02-18 | Disposition: A | Discharge status: Home / Self Care | Payer: BLUE CROSS/BLUE SHIELD | Attending: Surgery | Admitting: Surgery

## 2018-02-17 ENCOUNTER — Encounter: Payer: Self-pay | Admitting: Emergency Medicine

## 2018-02-17 DIAGNOSIS — Z9889 Other specified postprocedural states: Secondary | ICD-10-CM | POA: Insufficient documentation

## 2018-02-17 DIAGNOSIS — R52 Pain, unspecified: Secondary | ICD-10-CM

## 2018-02-17 DIAGNOSIS — Z79899 Other long term (current) drug therapy: Secondary | ICD-10-CM | POA: Insufficient documentation

## 2018-02-17 DIAGNOSIS — I10 Essential (primary) hypertension: Secondary | ICD-10-CM | POA: Insufficient documentation

## 2018-02-17 DIAGNOSIS — Z87891 Personal history of nicotine dependence: Secondary | ICD-10-CM | POA: Diagnosis not present

## 2018-02-17 DIAGNOSIS — K810 Acute cholecystitis: Secondary | ICD-10-CM

## 2018-02-17 DIAGNOSIS — Z90710 Acquired absence of both cervix and uterus: Secondary | ICD-10-CM | POA: Insufficient documentation

## 2018-02-17 DIAGNOSIS — Z90722 Acquired absence of ovaries, bilateral: Secondary | ICD-10-CM | POA: Insufficient documentation

## 2018-02-17 DIAGNOSIS — Z8249 Family history of ischemic heart disease and other diseases of the circulatory system: Secondary | ICD-10-CM | POA: Diagnosis not present

## 2018-02-17 DIAGNOSIS — K8010 Calculus of gallbladder with chronic cholecystitis without obstruction: Secondary | ICD-10-CM | POA: Diagnosis present

## 2018-02-17 DIAGNOSIS — K8020 Calculus of gallbladder without cholecystitis without obstruction: Secondary | ICD-10-CM

## 2018-02-17 HISTORY — PX: CHOLECYSTECTOMY: SHX55

## 2018-02-17 LAB — URINALYSIS, COMPLETE (UACMP) WITH MICROSCOPIC
BILIRUBIN URINE: NEGATIVE
Bacteria, UA: NONE SEEN
Glucose, UA: NEGATIVE mg/dL
Ketones, ur: NEGATIVE mg/dL
Leukocytes, UA: NEGATIVE
NITRITE: NEGATIVE
PROTEIN: NEGATIVE mg/dL
Specific Gravity, Urine: 1.004 — ABNORMAL LOW (ref 1.005–1.030)
pH: 7 (ref 5.0–8.0)

## 2018-02-17 LAB — COMPREHENSIVE METABOLIC PANEL
ALBUMIN: 4.2 g/dL (ref 3.5–5.0)
ALK PHOS: 53 U/L (ref 38–126)
ALT: 15 U/L (ref 0–44)
ANION GAP: 10 (ref 5–15)
AST: 30 U/L (ref 15–41)
BILIRUBIN TOTAL: 0.6 mg/dL (ref 0.3–1.2)
BUN: 11 mg/dL (ref 6–20)
CALCIUM: 9.3 mg/dL (ref 8.9–10.3)
CO2: 26 mmol/L (ref 22–32)
Chloride: 105 mmol/L (ref 98–111)
Creatinine, Ser: 0.83 mg/dL (ref 0.44–1.00)
GFR calc non Af Amer: >60 mL/min (ref >60)
Glucose, Bld: 113 mg/dL — ABNORMAL HIGH (ref 70–99)
Potassium: 3.7 mmol/L (ref 3.5–5.1)
Sodium: 141 mmol/L (ref 135–145)
TOTAL PROTEIN: 7.1 g/dL (ref 6.5–8.1)

## 2018-02-17 LAB — LIPASE, BLOOD: Lipase: 24 U/L (ref 11–51)

## 2018-02-17 LAB — CBC
HCT: 41.4 % (ref 35.0–47.0)
HEMOGLOBIN: 14.4 g/dL (ref 12.0–16.0)
MCH: 33 pg (ref 26.0–34.0)
MCHC: 34.6 g/dL (ref 32.0–36.0)
MCV: 95.1 fL (ref 80.0–100.0)
Platelets: 260 10*3/uL (ref 150–440)
RBC: 4.36 MIL/uL (ref 3.80–5.20)
RDW: 12.6 % (ref 11.5–14.5)
WBC: 7.3 10*3/uL (ref 3.6–11.0)

## 2018-02-17 LAB — RAPID HIV SCREEN (HIV 1/2 AB+AG)
HIV 1/2 Antibodies: NONREACTIVE
HIV-1 P24 Antigen - HIV24: NONREACTIVE

## 2018-02-17 LAB — POCT PREGNANCY, URINE: Preg Test, Ur: NEGATIVE

## 2018-02-17 SURGERY — LAPAROSCOPIC CHOLECYSTECTOMY
Anesthesia: General | Site: Abdomen

## 2018-02-17 MED ORDER — TRAMADOL HCL 50 MG PO TABS: 50.0000 mg | ORAL_TABLET | Freq: Four times a day (QID) | ORAL | Status: DC | PRN

## 2018-02-17 MED ORDER — ACETAMINOPHEN 10 MG/ML IV SOLN
INTRAVENOUS | Status: DC | PRN
  Administered 2018-02-17: 1000 mg via INTRAVENOUS

## 2018-02-17 MED ORDER — LIDOCAINE HCL (PF) 2 % IJ SOLN
INTRAMUSCULAR | Status: AC
  Filled 2018-02-17: qty 10

## 2018-02-17 MED ORDER — SUGAMMADEX SODIUM 200 MG/2ML IV SOLN
INTRAVENOUS | Status: DC | PRN
  Administered 2018-02-17: 200 mg via INTRAVENOUS

## 2018-02-17 MED ORDER — MIDAZOLAM HCL 2 MG/2ML IJ SOLN
INTRAMUSCULAR | Status: AC
  Filled 2018-02-17: qty 2

## 2018-02-17 MED ORDER — KETOROLAC TROMETHAMINE 30 MG/ML IJ SOLN
INTRAMUSCULAR | Status: DC | PRN
  Administered 2018-02-17: 30 mg via INTRAVENOUS

## 2018-02-17 MED ORDER — BUPIVACAINE HCL (PF) 0.5 % IJ SOLN
INTRAMUSCULAR | Status: AC
  Filled 2018-02-17: qty 30

## 2018-02-17 MED ORDER — SODIUM CHLORIDE 0.9 % IV BOLUS
1000.0000 mL | Freq: Once | INTRAVENOUS | Status: AC
  Administered 2018-02-17: 1000 mL via INTRAVENOUS

## 2018-02-17 MED ORDER — MORPHINE SULFATE (PF) 4 MG/ML IV SOLN
INTRAVENOUS | Status: AC
  Filled 2018-02-17: qty 1

## 2018-02-17 MED ORDER — FENTANYL CITRATE (PF) 100 MCG/2ML IJ SOLN
50.0000 ug | INTRAMUSCULAR | Status: DC | PRN
  Administered 2018-02-17: 50 ug via INTRAVENOUS

## 2018-02-17 MED ORDER — FENTANYL CITRATE (PF) 100 MCG/2ML IJ SOLN
INTRAMUSCULAR | Status: DC | PRN
  Administered 2018-02-17 (×3): 50 ug via INTRAVENOUS
  Administered 2018-02-17: 25 ug via INTRAVENOUS

## 2018-02-17 MED ORDER — ONDANSETRON HCL 4 MG/2ML IJ SOLN
4.0000 mg | Freq: Four times a day (QID) | INTRAMUSCULAR | Status: DC | PRN
  Administered 2018-02-17 (×2): 4 mg via INTRAVENOUS
  Filled 2018-02-17: qty 2

## 2018-02-17 MED ORDER — FENTANYL CITRATE (PF) 250 MCG/5ML IJ SOLN
INTRAMUSCULAR | Status: AC
  Filled 2018-02-17: qty 5

## 2018-02-17 MED ORDER — FAMOTIDINE 20 MG PO TABS
20.0000 mg | ORAL_TABLET | Freq: Once | ORAL | Status: AC
  Administered 2018-02-17: 20 mg via ORAL

## 2018-02-17 MED ORDER — HYDROMORPHONE HCL 1 MG/ML IJ SOLN
0.5000 mg | INTRAMUSCULAR | Status: DC | PRN
  Administered 2018-02-17: 0.5 mg via INTRAVENOUS
  Filled 2018-02-17: qty 1

## 2018-02-17 MED ORDER — HYDROMORPHONE HCL 1 MG/ML IJ SOLN
INTRAMUSCULAR | Status: DC | PRN
  Administered 2018-02-17: .5 mg via INTRAVENOUS
  Administered 2018-02-17: 0.5 mg via INTRAVENOUS

## 2018-02-17 MED ORDER — ONDANSETRON 4 MG PO TBDP: 4.0000 mg | ORAL_TABLET | Freq: Four times a day (QID) | ORAL | Status: DC | PRN

## 2018-02-17 MED ORDER — OXYCODONE-ACETAMINOPHEN 5-325 MG PO TABS
ORAL_TABLET | ORAL | Status: AC
  Filled 2018-02-17: qty 1

## 2018-02-17 MED ORDER — DOCUSATE SODIUM 100 MG PO CAPS: 100.0000 mg | ORAL_CAPSULE | Freq: Two times a day (BID) | ORAL | Status: DC | PRN

## 2018-02-17 MED ORDER — ROCURONIUM BROMIDE 100 MG/10ML IV SOLN
INTRAVENOUS | Status: DC | PRN
  Administered 2018-02-17: 30 mg via INTRAVENOUS

## 2018-02-17 MED ORDER — MORPHINE SULFATE (PF) 2 MG/ML IV SOLN
1.0000 mg | INTRAVENOUS | Status: DC | PRN
  Administered 2018-02-17: 1 mg via INTRAVENOUS
  Filled 2018-02-17: qty 1

## 2018-02-17 MED ORDER — ONDANSETRON 4 MG PO TBDP: 4.0000 mg | ORAL_TABLET | Freq: Three times a day (TID) | ORAL | 0 refills | Status: DC | PRN

## 2018-02-17 MED ORDER — LACTATED RINGERS IV SOLN
INTRAVENOUS | Status: DC
  Administered 2018-02-17: 09:00:00 via INTRAVENOUS

## 2018-02-17 MED ORDER — ENOXAPARIN SODIUM 40 MG/0.4ML ~~LOC~~ SOLN
40.0000 mg | SUBCUTANEOUS | Status: DC
  Administered 2018-02-18: 40 mg via SUBCUTANEOUS
  Filled 2018-02-17: qty 0.4

## 2018-02-17 MED ORDER — MORPHINE SULFATE (PF) 4 MG/ML IV SOLN
4.0000 mg | Freq: Once | INTRAVENOUS | Status: AC
  Administered 2018-02-17: 4 mg via INTRAVENOUS

## 2018-02-17 MED ORDER — ONDANSETRON 4 MG PO TBDP
4.0000 mg | ORAL_TABLET | Freq: Once | ORAL | Status: AC
  Administered 2018-02-17: 4 mg via ORAL

## 2018-02-17 MED ORDER — OXYCODONE-ACETAMINOPHEN 5-325 MG PO TABS: 1.0000 | ORAL_TABLET | ORAL | 0 refills | Status: DC | PRN

## 2018-02-17 MED ORDER — OXYBUTYNIN CHLORIDE 5 MG PO TABS: 5.0000 mg | ORAL_TABLET | Freq: Every day | ORAL | Status: DC | PRN

## 2018-02-17 MED ORDER — LACTATED RINGERS IV SOLN
INTRAVENOUS | Status: DC
  Administered 2018-02-17 (×2): via INTRAVENOUS

## 2018-02-17 MED ORDER — ONDANSETRON HCL 4 MG/2ML IJ SOLN
INTRAMUSCULAR | Status: AC
  Filled 2018-02-17: qty 2

## 2018-02-17 MED ORDER — LIDOCAINE HCL (CARDIAC) PF 100 MG/5ML IV SOSY
PREFILLED_SYRINGE | INTRAVENOUS | Status: DC | PRN
  Administered 2018-02-17: 100 mg via INTRAVENOUS

## 2018-02-17 MED ORDER — LACTATED RINGERS IV SOLN
INTRAVENOUS | Status: DC
  Administered 2018-02-17 – 2018-02-18 (×2): via INTRAVENOUS

## 2018-02-17 MED ORDER — PROPOFOL 10 MG/ML IV BOLUS
INTRAVENOUS | Status: DC | PRN
  Administered 2018-02-17: 150 mg via INTRAVENOUS

## 2018-02-17 MED ORDER — HYDROCODONE-ACETAMINOPHEN 5-325 MG PO TABS
1.0000 | ORAL_TABLET | ORAL | Status: DC | PRN
  Administered 2018-02-17 – 2018-02-18 (×3): 2 via ORAL
  Filled 2018-02-17 (×3): qty 2

## 2018-02-17 MED ORDER — DEXMEDETOMIDINE HCL 200 MCG/2ML IV SOLN
INTRAVENOUS | Status: DC | PRN
  Administered 2018-02-17: 8 ug via INTRAVENOUS

## 2018-02-17 MED ORDER — PROPOFOL 10 MG/ML IV BOLUS
INTRAVENOUS | Status: AC
  Filled 2018-02-17: qty 20

## 2018-02-17 MED ORDER — FAMOTIDINE 20 MG PO TABS
ORAL_TABLET | ORAL | Status: AC
  Administered 2018-02-17: 20 mg via ORAL
  Filled 2018-02-17: qty 1

## 2018-02-17 MED ORDER — OXYCODONE-ACETAMINOPHEN 5-325 MG PO TABS
1.0000 | ORAL_TABLET | Freq: Once | ORAL | Status: AC
  Administered 2018-02-17: 1 via ORAL

## 2018-02-17 MED ORDER — LIDOCAINE-EPINEPHRINE (PF) 1 %-1:200000 IJ SOLN
INTRAMUSCULAR | Status: DC | PRN
  Administered 2018-02-17: 15 mL via INTRADERMAL

## 2018-02-17 MED ORDER — FENTANYL CITRATE (PF) 100 MCG/2ML IJ SOLN: 25.0000 ug | INTRAMUSCULAR | Status: DC | PRN

## 2018-02-17 MED ORDER — ROCURONIUM BROMIDE 50 MG/5ML IV SOLN
INTRAVENOUS | Status: AC
  Filled 2018-02-17: qty 1

## 2018-02-17 MED ORDER — ONDANSETRON HCL 4 MG/2ML IJ SOLN: 4.0000 mg | Freq: Once | INTRAMUSCULAR | Status: DC | PRN

## 2018-02-17 MED ORDER — ONDANSETRON HCL 4 MG/2ML IJ SOLN
4.0000 mg | Freq: Once | INTRAMUSCULAR | Status: AC
  Administered 2018-02-17: 4 mg via INTRAVENOUS

## 2018-02-17 MED ORDER — FENTANYL CITRATE (PF) 100 MCG/2ML IJ SOLN
INTRAMUSCULAR | Status: AC
  Administered 2018-02-17: 50 ug via INTRAVENOUS
  Filled 2018-02-17: qty 2

## 2018-02-17 MED ORDER — ACETAMINOPHEN 10 MG/ML IV SOLN
INTRAVENOUS | Status: AC
  Filled 2018-02-17: qty 100

## 2018-02-17 MED ORDER — HYDROCHLOROTHIAZIDE 25 MG PO TABS
25.0000 mg | ORAL_TABLET | Freq: Every day | ORAL | Status: DC
  Administered 2018-02-18: 25 mg via ORAL
  Filled 2018-02-17: qty 1

## 2018-02-17 MED ORDER — SODIUM CHLORIDE 0.9 % IV SOLN
2.0000 g | Freq: Every day | INTRAVENOUS | Status: DC
  Administered 2018-02-17: 2 g via INTRAVENOUS
  Filled 2018-02-17 (×2): qty 20

## 2018-02-17 MED ORDER — LIDOCAINE-EPINEPHRINE (PF) 1 %-1:200000 IJ SOLN
INTRAMUSCULAR | Status: AC
  Filled 2018-02-17: qty 30

## 2018-02-17 MED ORDER — DEXAMETHASONE SODIUM PHOSPHATE 10 MG/ML IJ SOLN
INTRAMUSCULAR | Status: DC | PRN
  Administered 2018-02-17: 10 mg via INTRAVENOUS

## 2018-02-17 MED ORDER — HYDROMORPHONE HCL 1 MG/ML IJ SOLN
INTRAMUSCULAR | Status: AC
  Filled 2018-02-17: qty 1

## 2018-02-17 MED ORDER — MIDAZOLAM HCL 2 MG/2ML IJ SOLN
INTRAMUSCULAR | Status: DC | PRN
  Administered 2018-02-17: 2 mg via INTRAVENOUS

## 2018-02-17 MED ORDER — ONDANSETRON 4 MG PO TBDP
ORAL_TABLET | ORAL | Status: AC
  Filled 2018-02-17: qty 1

## 2018-02-17 SURGICAL SUPPLY — 60 items
ADH SKN CLS APL DERMABOND .7 (GAUZE/BANDAGES/DRESSINGS) ×1
APL SWBSTK 6 STRL LF DISP (MISCELLANEOUS)
APPLICATOR COTTON TIP 6 STRL (MISCELLANEOUS) IMPLANT
APPLICATOR COTTON TIP 6IN STRL (MISCELLANEOUS)
APPLIER CLIP 5 13 M/L LIGAMAX5 (MISCELLANEOUS) ×3
APR CLP MED LRG 5 ANG JAW (MISCELLANEOUS) ×1
BAG SPEC RTRVL LRG 6X4 10 (ENDOMECHANICALS) ×1
BLADE SURG SZ11 CARB STEEL (BLADE) ×3 IMPLANT
CANISTER SUCT 1200ML W/VALVE (MISCELLANEOUS) ×3 IMPLANT
CHLORAPREP W/TINT 26ML (MISCELLANEOUS) ×3 IMPLANT
CHOLANGIOGRAM CATH TAUT (CATHETERS) IMPLANT
CLIP APPLIE 5 13 M/L LIGAMAX5 (MISCELLANEOUS) ×1 IMPLANT
DECANTER SPIKE VIAL GLASS SM (MISCELLANEOUS) ×6 IMPLANT
DEFOGGER SCOPE WARMER CLEARIFY (MISCELLANEOUS) IMPLANT
DERMABOND ADVANCED (GAUZE/BANDAGES/DRESSINGS) ×2
DERMABOND ADVANCED .7 DNX12 (GAUZE/BANDAGES/DRESSINGS) ×1 IMPLANT
DISSECTOR BLUNT TIP ENDO 5MM (MISCELLANEOUS) IMPLANT
DISSECTOR KITTNER STICK (MISCELLANEOUS) ×1 IMPLANT
DISSECTORS/KITTNER STICK (MISCELLANEOUS) ×3
DRAPE C-ARM XRAY 36X54 (DRAPES) IMPLANT
DRAPE SHEET LG 3/4 BI-LAMINATE (DRAPES) IMPLANT
ELECT CAUTERY BLADE 6.4 (BLADE) IMPLANT
ELECT REM PT RETURN 9FT ADLT (ELECTROSURGICAL) ×3
ELECTRODE REM PT RTRN 9FT ADLT (ELECTROSURGICAL) ×1 IMPLANT
GLOVE BIOGEL PI IND STRL 7.0 (GLOVE) ×1 IMPLANT
GLOVE BIOGEL PI INDICATOR 7.0 (GLOVE) ×2
GLOVE SURG SYN 7.0 (GLOVE) ×3 IMPLANT
GLOVE SURG SYN 7.0 PF PI (GLOVE) ×1 IMPLANT
GOWN STRL REUS W/ TWL LRG LVL3 (GOWN DISPOSABLE) ×3 IMPLANT
GOWN STRL REUS W/TWL LRG LVL3 (GOWN DISPOSABLE) ×9
GRASPER SUT TROCAR 14GX15 (MISCELLANEOUS) ×3 IMPLANT
IRRIGATION STRYKERFLOW (MISCELLANEOUS) IMPLANT
IRRIGATOR STRYKERFLOW (MISCELLANEOUS)
IV CATH ANGIO 12GX3 LT BLUE (NEEDLE) IMPLANT
IV NS 1000ML (IV SOLUTION) ×3
IV NS 1000ML BAXH (IV SOLUTION) ×1 IMPLANT
JACKSON PRATT 10 (INSTRUMENTS) IMPLANT
L-HOOK LAP DISP 36CM (ELECTROSURGICAL) ×3
LHOOK LAP DISP 36CM (ELECTROSURGICAL) ×1 IMPLANT
NEEDLE HYPO 22GX1.5 SAFETY (NEEDLE) ×3 IMPLANT
PACK LAP CHOLECYSTECTOMY (MISCELLANEOUS) ×3 IMPLANT
PENCIL ELECTRO HAND CTR (MISCELLANEOUS) ×3 IMPLANT
PORT ACCESS TROCAR AIRSEAL 5 (TROCAR) ×3 IMPLANT
POUCH SPECIMEN RETRIEVAL 10MM (ENDOMECHANICALS) ×3 IMPLANT
SCISSORS METZENBAUM CVD 33 (INSTRUMENTS) ×3 IMPLANT
SET TRI-LUMEN FLTR TB AIRSEAL (TUBING) ×3 IMPLANT
SLEEVE ENDOPATH XCEL 5M (ENDOMECHANICALS) ×3 IMPLANT
SPONGE LAP 18X18 RF (DISPOSABLE) IMPLANT
STOPCOCK 4 WAY LG BORE MALE ST (IV SETS) IMPLANT
SUT MNCRL 4-0 (SUTURE) ×3
SUT MNCRL 4-0 27XMFL (SUTURE) ×1
SUT VIC AB 3-0 SH 27 (SUTURE)
SUT VIC AB 3-0 SH 27X BRD (SUTURE) IMPLANT
SUT VICRYL 0 AB UR-6 (SUTURE) ×6 IMPLANT
SUTURE MNCRL 4-0 27XMF (SUTURE) ×1 IMPLANT
SYR 20CC LL (SYRINGE) ×3 IMPLANT
TOWEL OR 17X26 4PK STRL BLUE (TOWEL DISPOSABLE) ×3 IMPLANT
TROCAR XCEL BLUNT TIP 100MML (ENDOMECHANICALS) ×3 IMPLANT
TROCAR XCEL NON-BLD 5MMX100MML (ENDOMECHANICALS) ×9 IMPLANT
WATER STERILE IRR 1000ML POUR (IV SOLUTION) ×3 IMPLANT

## 2018-02-17 NOTE — ED Triage Notes (Signed)
Pt presents to ED with severe right upper abd pain. Pt crying in triage holding her abd. Pt is scheduled to have her gallbladder removed on Tuesday. Denies vomiting. Denies similar pain previously.

## 2018-02-17 NOTE — Anesthesia Procedure Notes (Signed)
Procedure Name: Intubation Date/Time: 02/17/2018 5:48 PM Performed by: Johnna Acosta, CRNA Pre-anesthesia Checklist: Patient identified, Emergency Drugs available, Suction available, Patient being monitored and Timeout performed Patient Re-evaluated:Patient Re-evaluated prior to induction Oxygen Delivery Method: Circle system utilized Preoxygenation: Pre-oxygenation with 100% oxygen Induction Type: IV induction Ventilation: Mask ventilation without difficulty Laryngoscope Size: Miller and 2 Grade View: Grade I Tube type: Oral Tube size: 7.0 mm Number of attempts: 1 Airway Equipment and Method: Stylet Placement Confirmation: ETT inserted through vocal cords under direct vision,  positive ETCO2 and breath sounds checked- equal and bilateral Secured at: 21 cm Tube secured with: Tape Dental Injury: Teeth and Oropharynx as per pre-operative assessment

## 2018-02-17 NOTE — Transfer of Care (Signed)
Immediate Anesthesia Transfer of Care Note  Patient: Kathleen Hall  Procedure(s) Performed: LAPAROSCOPIC CHOLECYSTECTOMY (N/A Abdomen)  Patient Location: PACU  Anesthesia Type:General  Level of Consciousness: sedated  Airway & Oxygen Therapy: Patient Spontanous Breathing and Patient connected to nasal cannula oxygen  Post-op Assessment: Report given to RN and Post -op Vital signs reviewed and stable  Post vital signs: Reviewed and stable  Last Vitals:  Vitals Value Taken Time  BP 130/76 02/17/2018  7:37 PM  Temp 36.6 C 02/17/2018  7:35 PM  Pulse 63 02/17/2018  7:38 PM  Resp 20 02/17/2018  7:38 PM  SpO2 100 % 02/17/2018  7:38 PM  Vitals shown include unvalidated device data.  Last Pain:  Vitals:   02/17/18 1935  TempSrc:   PainSc: Asleep         Complications: No apparent anesthesia complications

## 2018-02-17 NOTE — Op Note (Signed)
Preoperative diagnosis:  acute cholecystitis  Postoperative diagnosis: same as above  Procedure: Laparoscopic Cholecystectomy.   Anesthesia: GETA   Surgeon: Benjamine Sprague  Specimen: Gallbladder  Complications: None  EBL: 65mL  Wound Classification: Clean Contaminated  Indications: see HPI  Findings: Critical view of safety noted Cystic duct and artery identified, ligated and divided, clips remained intact at end of procedure Adequate hemostasis  Description of procedure: The patient was placed on the operating table in the supine position. SCDs placed, pre-op abx administered.  General anesthesia was induced and OG tube placed by anesthesia. A time-out was completed verifying correct patient, procedure, site, positioning, and implant(s) and/or special equipment prior to beginning this procedure. The abdomen was prepped and draped in the usual sterile fashion.  An incision was made in a natural skin line under the umbilicus.  Dissection carried down to fascia where two 0 vicryl sutures placed to use as anchor sutures for hasson port.  Incision made into fascia and blunt dissection used to enter peritoneum.  Hasson port placed and insufflation started up to 46mm Hg without any dramatic increase in pressure.    The laparoscope was inserted and the abdomen inspected. No injuries from initial trocar placement were noted. Additional trocars were then inserted under direct visualization in the following locations: a 5-mm trocar in the subxyphoid region and two 5-mm trocars along the right costal margin. The abdomen was inspected and no abnormalities or injuries were found. The table was placed in the reverse Trendelenburg position with the right side up.  Filmy adhesions between the gallbladder and omentum, duodenum and transverse colon were lysed sharply. The dome of the gallbladder was grasped with an atraumatic grasper passed through the lateral port and retracted over the dome of the liver.  The infundibulum was also grasped with an atraumatic grasper and retracted toward the right lower quadrant. This maneuver exposed Calot's triangle. The peritoneum overlying the gallbladder infundibulum was then dissected and the cystic duct and cystic artery identified.  Critical view of safety with the liver bed clearly visible behind the duct and artery with no additional structures noted.  Picture taken before the cystic duct and cystic artery clipped and divided close to the gallbladder.  The gallbladder was then dissected from its peritoneal and liver bed attachments by electrocautery.  Additional clips placed within the peritoneal attachments toward the infundibulum of the gallbladder to obtain hemostasis for a pulsatile vessel.  This was separate from the portal triad further below in the operative field.  Hemostasis was checked and the gallbladder was removed using an endoscopic retrieval bag placed through the umbilical port. The gallbladder was passed off the table as a specimen. The gallbladder fossa was copiously irrigated with saline and any leaked bile was suctioned out, and hemostasis was obtained. There was no evidence of bleeding from the gallbladder fossa or cystic artery or leakage of the bile from the cystic duct stump. PMI used to close umbilical site using 0vicryl under direct visualizaion.    Abdomen desufflated and secondary trocars were removed. No bleeding was noted. The laparoscope was withdrawn and the umbilical trocar removed.  The fascia of the Hasson trocar site was closed with figure-of-eight 0 vicryl sutures.  3-0 vicryl used to close deep dermal layer at umbilical site.  All skin incisions then closed with subcuticular sutures of 4-0 monocryl and dressed with topical skin adhesive. The orogastric tube was removed and patient extubated. The patient tolerated the procedure well and was taken to the postanesthesia care  unit in stable condition.  All sponge and instrument count  correct at end of procedure.

## 2018-02-17 NOTE — ED Notes (Signed)
Surgeon bedside for consult

## 2018-02-17 NOTE — H&P (Signed)
Subjective:   CC: Biliary colic [P37.90]  HPI:  Kathleen Hall is a 47 y.o. female who was referred by Owens Shark for evaluation of above CC. Symptoms were first noted a few days ago. Pain is achy, intermittent and occurst at night., confined to the RUQ, without radiation.  Associated with nausea, but no emesis, exacerbated by nothing specific.   She had another acute episode late last night and presented to the ED with unrelenting pain.  Despite pain medication administration the pain did not improve so I was consulted for her for a urgent surgical intervention.    Past Medical History:  has a past medical history of Abnormal cytology, Chronic constipation, Fibrocystic breast disease, History of headache, Hypertension, Recurrent sinusitis, unspecified, and Urinary incontinence.  Past Surgical History:  has a past surgical history that includes Implant in tooth; Laparoscopic Salpingectomy (Bilateral, 04/2017); Hysterectomy (04-2018); and Tubal ligation.  Family History: family history includes Diabetes in her paternal aunt and paternal grandmother; Diabetes type II in her brother, father, paternal aunt, and paternal grandmother; High blood pressure (Hypertension) in her paternal uncle; Lumbar disc disease in her brother; Myocardial Infarction (Heart attack) in her paternal grandfather; No Known Problems in her mother; Seizures in her brother.  Social History:  reports that she quit smoking about 7 years ago. She has a 15.00 pack-year smoking history. She has never used smokeless tobacco. She reports that she drinks alcohol. She reports that she does not use drugs.  Current Medications: has a current medication list which includes the following prescription(s): fluticasone propionate, hydrochlorothiazide, ibuprofen, oxybutynin, and polyethylene glycol.  Allergies:  Allergies as of 02/15/2018  . (No Known Allergies)    ROS:  A 15 point review of systems was performed and pertinent positives and  negatives noted in HPI    Objective:     BP 133/87   Pulse 78   Temp 36.4 C (97.6 F) (Oral)   Ht 154.9 cm (5\' 1" )   Wt 64.3 kg (141 lb 12.8 oz)   BMI 26.79 kg/m    Constitutional :  alert, appears stated age, cooperative and no distress  Lymphatics/Throat:  no asymmetry, masses, or scars  Respiratory:  clear to auscultation bilaterally  Cardiovascular:  regular rate and rhythm  Gastrointestinal: soft, tender to RUQ; bowel sounds normal; no masses,  no organomegaly.    Musculoskeletal: Steady gait and movement  Skin: Cool and moist,   Psychiatric: Normal affect, non-agitated, not confused       LABS:  WBC, LFTs, lipase WNL   RADS: CLINICAL DATA:  Initial evaluation for acute severe right upper quadrant pain.  EXAM: ULTRASOUND ABDOMEN LIMITED RIGHT UPPER QUADRANT  COMPARISON:  Prior ultrasound from 02/09/2018  FINDINGS: Gallbladder:  Sludge and stones present within the gallbladder lumen, with 2 approximate 5 mm stones present within the gallbladder neck. These were not seen to move with change in patient positioning. Gallbladder wall measure within normal limits at 2.8 mm. No free pericholecystic fluid. No sonographic Murphy sign elicited on exam.  Common bile duct:  Diameter: 2.5 mm  Liver:  No focal lesion identified. Within normal limits in parenchymal echogenicity. Portal vein is patent on color Doppler imaging with normal direction of blood flow towards the liver.  IMPRESSION: 1. Stones and sludge within the gallbladder lumen, with a few small stones appearing lodged within the gallbladder neck. No other imaging features to suggest acute cholecystitis. 2. No biliary dilatation.  Initial evaluation please pick the correct US ABDOMEN LIMITED template.  Electronically Signed   By: Jeannine Boga M.D.   On: 02/17/2018 04:26 Assessment:      Biliary colic [E94.07]  Plan:     1. Biliary colic [W80.88] Discussed the risk  of surgery including post-op infxn, seroma, biloma, chronic pain, poor-delayed wound healing, retained gallstone, conversion to open procedure, post-op SBO or ileus, and need for additional procedures to address said risks.  The risks of general anesthetic including MI, CVA, sudden death or even reaction to anesthetic medications also discussed. Alternatives include continued observation.  Benefits include possible symptom relief, prevention of complications including acute cholecystitis, pancreatitis.  Typical post operative recovery of 3-5 days rest, continued pain in area and incision sites, possible loose stools up to 4-6 weeks, also discussed.  Patient incidentally reported that during her previous GYN surgery the surgeons explained to her that all her "intestines were on the left side" patient denies any chronic obstructive type issues except for occasional constipation.  During the lap chole procedure we will also do a diagnostic laparoscopy to see if there is any mild degree of intestinal malrotation and potentially address that issue if found.  This was also discussed with the patient and the patient understands the additional risks of possible bowel resection if needed.  The patient and husband at bedside understands the risks, any and all questions were answered to the patient's satisfaction.  2. Patient has elected to proceed with surgical treatment. NPO, IVF, IV abx on the floor while waiting for OR time

## 2018-02-17 NOTE — Anesthesia Preprocedure Evaluation (Signed)
Anesthesia Evaluation  Patient identified by MRN, date of birth, ID band Patient awake    Reviewed: Allergy & Precautions, NPO status , Patient's Chart, lab work & pertinent test results, reviewed documented beta blocker date and time   Airway Mallampati: II  TM Distance: >3 FB     Dental  (+) Chipped   Pulmonary former smoker,           Cardiovascular hypertension, Pt. on medications      Neuro/Psych    GI/Hepatic   Endo/Other    Renal/GU      Musculoskeletal   Abdominal   Peds  Hematology  (+) anemia ,   Anesthesia Other Findings   Reproductive/Obstetrics                             Anesthesia Physical Anesthesia Plan  ASA: II  Anesthesia Plan: General   Post-op Pain Management:    Induction: Intravenous  PONV Risk Score and Plan:   Airway Management Planned: Oral ETT  Additional Equipment:   Intra-op Plan:   Post-operative Plan:   Informed Consent: I have reviewed the patients History and Physical, chart, labs and discussed the procedure including the risks, benefits and alternatives for the proposed anesthesia with the patient or authorized representative who has indicated his/her understanding and acceptance.     Plan Discussed with: CRNA  Anesthesia Plan Comments:         Anesthesia Quick Evaluation

## 2018-02-17 NOTE — ED Provider Notes (Signed)
[facility] Emergency Department Provider Note   First MD Initiated Contact with Patient 02/17/18 0403     (approximate)  I have reviewed the triage vital signs and the nursing notes.   HISTORY  Chief Complaint Abdominal Pain    HPI Kathleen Hall is a 47 y.o. female history of hypertension and anemia and recently diagnosed gallstones with plan for cholecystectomy on February 21, 2018 presents to the emergency department with acute onset of 10 out of 10 right upper quadrant epigastric pain which began this morning.  Patient also admits to nausea and vomiting as well.  Patient denies any fever afebrile on presentation.  Patient denies any urinary symptoms.   Past Medical History:  Diagnosis Date  . Anemia   . Hypertension     There are no active problems to display for this patient.   Past Surgical History:  Procedure Laterality Date  . COLONOSCOPY    . HYSTEROSCOPY WITH NOVASURE N/A 05/06/2017   Procedure: HYSTEROSCOPY WITH NOVASURE;  Surgeon: Ward, Honor Loh, MD;  Location: [facility] ORS;  Service: Gynecology;  Laterality: N/A;  . IUD REMOVAL N/A 05/06/2017   Procedure: INTRAUTERINE DEVICE (IUD) REMOVAL;  Surgeon: Ward, Honor Loh, MD;  Location: [facility] ORS;  Service: Gynecology;  Laterality: N/A;  . LAPAROSCOPIC BILATERAL SALPINGECTOMY Bilateral 05/06/2017   Procedure: LAPAROSCOPIC BILATERAL SALPINGECTOMY;  Surgeon: Ward, Honor Loh, MD;  Location: [facility] ORS;  Service: Gynecology;  Laterality: Bilateral;  . LAPAROSCOPIC UNILATERAL SALPINGO OOPHERECTOMY N/A 05/06/2017   Procedure: LAPAROSCOPIC UNILATERAL SALPINGO OOPHORECTOMY;  Surgeon: Ward, Honor Loh, MD;  Location: [facility] ORS;  Service: Gynecology;  Laterality: N/A;    Prior to Admission medications   Medication Sig Start Date End Date Taking? Authorizing Provider  fluticasone (FLONASE) 50 MCG/ACT nasal spray Place 2 sprays into both nostrils daily as needed for allergies or congestion. 04/10/17    [provider]  hydrochlorothiazide (HYDRODIURIL) 25 MG tablet Take 25 mg by mouth daily.    [provider]  ibuprofen (ADVIL,MOTRIN) 200 MG tablet Take 400 mg by mouth every 8 (eight) hours as needed (for pain.).    [provider]  phentermine 15 MG capsule Take 15 mg by mouth every morning.    [provider]  Polyethyl Glycol-Propyl Glycol (LUBRICANT EYE DROPS) 0.4-0.3 % SOLN Place 1-2 drops into both eyes 3 (three) times daily as needed (for dry/irritated eyes).    [provider]  polyethylene glycol (MIRALAX / GLYCOLAX) packet Take 17 g by mouth daily. Mix with coffee.    [provider]    Allergies No known drug allergies  Family History  Problem Relation Age of Onset  . Breast cancer Neg Hx     Social History Social History   Tobacco Use  . Smoking status: Former Research scientist (life sciences)  . Smokeless tobacco: Never Used  Substance Use Topics  . Alcohol use: Yes  . Drug use: No    Review of Systems Constitutional: No fever/chills Eyes: No visual changes. ENT: No sore throat. Cardiovascular: Denies chest pain. Respiratory: Denies shortness of breath. Gastrointestinal: Positive for abdominal pain nausea and vomiting.  No diarrhea.  No constipation. Genitourinary: Negative for dysuria. Musculoskeletal: Negative for neck pain.  Negative for back pain. Integumentary: Negative for rash. Neurological: Negative for headaches, focal weakness or numbness.   ____________________________________________   PHYSICAL EXAM:  VITAL SIGNS: ED Triage Vitals  Enc Vitals Group     BP 02/17/18 0206 (!) 152/99     Pulse Rate 02/17/18 0206 Marland Kitchen)  116     Resp 02/17/18 0206 (!) 24     Temp 02/17/18 0206 97.8 F (36.6 C)     Temp Source 02/17/18 0206 Oral     SpO2 02/17/18 0206 100 %     Weight 02/17/18 0207 59 kg (130 lb)     Height 02/17/18 0207 1.549 m (5\' 1" )     Head Circumference --      Peak Flow --      Pain Score 02/17/18 0207 10       Pain Loc --      Pain Edu? --      Excl. in [place]? --     Constitutional: Alert and oriented.  Apparent distress eyes: Conjunctivae are normal. PERRL. EOMI. Head: Atraumatic. Mouth/Throat: Mucous membranes are moist. Oropharynx non-erythematous. Neck: No stridor.  Cardiovascular: Normal rate, regular rhythm. Good peripheral circulation. Grossly normal heart sounds. Respiratory: Normal respiratory effort.  No retractions. Lungs CTAB. Gastrointestinal: Right upper quadrant tenderness to palpation. No distention.  Musculoskeletal: No lower extremity tenderness nor edema. No gross deformities of extremities. Neurologic:  Normal speech and language. No gross focal neurologic deficits are appreciated.  Skin:  Skin is warm, dry and intact. No rash noted. Psychiatric: Mood and affect are normal. Speech and behavior are normal.  ____________________________________________   LABS (all labs ordered are listed, but only abnormal results are displayed)  Labs Reviewed  COMPREHENSIVE METABOLIC PANEL - Abnormal; Notable for the following components:      Result Value   Glucose, Bld 113 (*)    All other components within normal limits  LIPASE, BLOOD  CBC  URINALYSIS, COMPLETE (UACMP) WITH MICROSCOPIC    RADIOLOGY I, [place] Napolitano, personally viewed and evaluated these images (plain radiographs) as part of my medical decision making, as well as reviewing the written report by the radiologist.  ED MD interpretation: Still's of possible stones lodged in the gallbladder neck no evidence of cholecystitis.  Official radiology report(s): US Abdomen Limited Ruq  Result Date: 02/17/2018 CLINICAL DATA:  Initial evaluation for acute severe right upper quadrant pain. EXAM: ULTRASOUND ABDOMEN LIMITED RIGHT UPPER QUADRANT COMPARISON:  Prior ultrasound from 02/09/2018 FINDINGS: Gallbladder: Sludge and stones present within the gallbladder lumen, with 2 approximate 5 mm stones present within the  gallbladder neck. These were not seen to move with change in patient positioning. Gallbladder wall measure within normal limits at 2.8 mm. No free pericholecystic fluid. No sonographic Murphy sign elicited on exam. Common bile duct: Diameter: 2.5 mm Liver: No focal lesion identified. Within normal limits in parenchymal echogenicity. Portal vein is patent on color Doppler imaging with normal direction of blood flow towards the liver. IMPRESSION: 1. Stones and sludge within the gallbladder lumen, with a few small stones appearing lodged within the gallbladder neck. No other imaging features to suggest acute cholecystitis. 2. No biliary dilatation. Initial evaluation please pick the correct US ABDOMEN LIMITED template. Electronically Signed   By: Jeannine Boga M.D.   On: 02/17/2018 04:26     Procedures   ____________________________________________   INITIAL IMPRESSION / ASSESSMENT AND PLAN / ED COURSE  As part of my medical decision making, I reviewed the following data within the electronic MEDICAL RECORD NUMBER   47 year old female presenting with above-stated history and physical exam concerning for cholelithiasis versus cholecystitis.  Ultrasound revealed cholelithiasis patient given IV morphine and Zofran with complete resolution of pain and nausea.    ____________________________________________  FINAL CLINICAL IMPRESSION(S) / ED DIAGNOSES  Final diagnoses:  Pain  Gallstones     MEDICATIONS GIVEN DURING THIS VISIT:  Medications  fentaNYL (SUBLIMAZE) injection 50 mcg (50 mcg Intravenous Given 02/17/18 0219)  morphine 4 MG/ML injection 4 mg (4 mg Intravenous Given 02/17/18 0407)  ondansetron (ZOFRAN) injection 4 mg (4 mg Intravenous Given 02/17/18 0407)  sodium chloride 0.9 % bolus 1,000 mL (0 mLs Intravenous Stopped 02/17/18 0514)     ED Discharge Orders    None       Note:  This document was prepared using Dragon voice recognition software and may include unintentional  dictation errors.    Gregor Hams, MD 02/17/18 [phone]

## 2018-02-17 NOTE — Anesthesia Post-op Follow-up Note (Signed)
Anesthesia QCDR form completed.        

## 2018-02-17 NOTE — ED Notes (Signed)
Lab results reviewed

## 2018-02-17 NOTE — ED Notes (Signed)
Pt given ginger ale at this time per Owens Shark, MD.

## 2018-02-18 LAB — BASIC METABOLIC PANEL
Anion gap: 9 (ref 5–15)
BUN: 11 mg/dL (ref 6–20)
CHLORIDE: 103 mmol/L (ref 98–111)
CO2: 26 mmol/L (ref 22–32)
CREATININE: 0.89 mg/dL (ref 0.44–1.00)
Calcium: 8.1 mg/dL — ABNORMAL LOW (ref 8.9–10.3)
GFR calc non Af Amer: >60 mL/min (ref >60)
Glucose, Bld: 138 mg/dL — ABNORMAL HIGH (ref 70–99)
Potassium: 3.9 mmol/L (ref 3.5–5.1)
Sodium: 138 mmol/L (ref 135–145)

## 2018-02-18 LAB — CBC WITH DIFFERENTIAL/PLATELET
Basophils Absolute: 0.1 10*3/uL (ref 0–0.1)
Basophils Relative: 1 %
Eosinophils Absolute: 0 10*3/uL (ref 0–0.7)
Eosinophils Relative: 0 %
HEMATOCRIT: 36.9 % (ref 35.0–47.0)
HEMOGLOBIN: 13 g/dL (ref 12.0–16.0)
LYMPHS PCT: 5 %
Lymphs Abs: 0.4 10*3/uL — ABNORMAL LOW (ref 1.0–3.6)
MCH: 34.3 pg — ABNORMAL HIGH (ref 26.0–34.0)
MCHC: 35.2 g/dL (ref 32.0–36.0)
MCV: 97.5 fL (ref 80.0–100.0)
Monocytes Absolute: 0.3 10*3/uL (ref 0.2–0.9)
Monocytes Relative: 4 %
NEUTROS ABS: 6.8 10*3/uL — AB (ref 1.4–6.5)
NEUTROS PCT: 90 %
Platelets: 203 10*3/uL (ref 150–440)
RBC: 3.78 MIL/uL — AB (ref 3.80–5.20)
RDW: 12.5 % (ref 11.5–14.5)
WBC: 7.6 10*3/uL (ref 3.6–11.0)

## 2018-02-18 LAB — HEPATIC FUNCTION PANEL
ALT: 140 U/L — ABNORMAL HIGH (ref 0–44)
AST: 237 U/L — ABNORMAL HIGH (ref 15–41)
Albumin: 3.3 g/dL — ABNORMAL LOW (ref 3.5–5.0)
Alkaline Phosphatase: 95 U/L (ref 38–126)
BILIRUBIN DIRECT: 0.5 mg/dL — AB (ref 0.0–0.2)
BILIRUBIN TOTAL: 1.5 mg/dL — AB (ref 0.3–1.2)
Indirect Bilirubin: 1 mg/dL — ABNORMAL HIGH (ref 0.3–0.9)
Total Protein: 6 g/dL — ABNORMAL LOW (ref 6.5–8.1)

## 2018-02-18 LAB — PHOSPHORUS: PHOSPHORUS: 3.4 mg/dL (ref 2.5–4.6)

## 2018-02-18 LAB — HEMOGLOBIN AND HEMATOCRIT, BLOOD
HEMATOCRIT: 36.7 % (ref 35.0–47.0)
HEMOGLOBIN: 12.7 g/dL (ref 12.0–16.0)

## 2018-02-18 LAB — MAGNESIUM: Magnesium: 2 mg/dL (ref 1.7–2.4)

## 2018-02-18 MED ORDER — OXYCODONE-ACETAMINOPHEN 5-325 MG PO TABS: 1.0000 | ORAL_TABLET | Freq: Four times a day (QID) | ORAL | 0 refills | Status: AC | PRN

## 2018-02-18 MED ORDER — OXYCODONE-ACETAMINOPHEN 5-325 MG PO TABS: 1.0000 | ORAL_TABLET | Freq: Four times a day (QID) | ORAL | 0 refills | Status: DC | PRN

## 2018-02-18 NOTE — Discharge Instructions (Signed)
-   tylenol and advil as needed for discomfort.  Please alternate between the two every four hours as needed for pain.   - Use narcotics, if prescribed, only when tylenol and motrin is not enough to control pain. - 325-650mg  every 8hrs to max of 4000mg /24hrs (including the 325mg  in every norco dose) for the tylenol.   - Advil up to 800mg  per dose every 8hrs as needed for pain.

## 2018-02-18 NOTE — Anesthesia Postprocedure Evaluation (Signed)
Anesthesia Post Note  Patient: Kathleen Hall  Procedure(s) Performed: LAPAROSCOPIC CHOLECYSTECTOMY (N/A Abdomen)  Patient location during evaluation: PACU Anesthesia Type: General Level of consciousness: awake and alert Pain management: pain level controlled Vital Signs Assessment: post-procedure vital signs reviewed and stable Respiratory status: spontaneous breathing, nonlabored ventilation, respiratory function stable and patient connected to nasal cannula oxygen Cardiovascular status: blood pressure returned to baseline and stable Postop Assessment: no apparent nausea or vomiting Anesthetic complications: no     Last Vitals:  Vitals:   02/17/18 2227 02/18/18 0341  BP: 113/79 (!) 118/58  Pulse: (!) 58 (!) 56  Resp: 18 16  Temp: (!) 36.3 C 36.9 C  SpO2: 99% 100%    Last Pain:  Vitals:   02/18/18 0523  TempSrc:   PainSc: 6                  Precious Haws Letanya Froh

## 2018-02-18 NOTE — Progress Notes (Signed)
IS education complete, pt understands technique and reason for use. Pt inspire 20105ml+. Pt independent with use

## 2018-02-19 ENCOUNTER — Encounter: Payer: Self-pay | Admitting: Surgery

## 2018-02-19 NOTE — Discharge Summary (Signed)
Physician Discharge Summary  Patient ID: Kathleen Hall MRN: [id number] DOB/AGE: 46-Jun-1972 46 y.o.  Admit date: 02/17/2018 Discharge date: 02/19/2018  Admission Diagnoses: acute cholecystitis  Discharge Diagnoses:  Same as above  Discharged Condition: good  Hospital Course: Pt admitted for acute on chronic cholecystitis.  Lap chole performed.  Recovered well post op with tolerating diet, pain controlled with oral meds.  Ready for d/c  Consults: None  Discharge Exam: Blood pressure 113/74, pulse 69, temperature 98.3 F (36.8 C), temperature source Oral, resp. rate 18, height 5\' 1"  (1.549 m), weight 59 kg, SpO2 100 %. General appearance: alert and no distress GI: soft, non-tender; bowel sounds normal; no masses,  no organomegaly and port site incisions clean/dry/intact  Disposition:  Discharge disposition: 01-Home or Self Care       Discharge Instructions    Discharge patient   Complete by:  As directed    Discharge disposition:  01-Home or Self Care   Discharge patient date:  02/18/2018     Allergies as of 02/18/2018   No Known Allergies     Medication List    TAKE these medications   fluticasone 50 MCG/ACT nasal spray Commonly known as:  FLONASE Place 2 sprays into both nostrils daily as needed for allergies or congestion.   hydrochlorothiazide 25 MG tablet Commonly known as:  HYDRODIURIL Take 25 mg by mouth daily.   ibuprofen 200 MG tablet Commonly known as:  ADVIL,MOTRIN Take 400 mg by mouth every 8 (eight) hours as needed (for pain.).   LUBRICANT EYE DROPS 0.4-0.3 % Soln Generic drug:  Polyethyl Glycol-Propyl Glycol Place 1-2 drops into both eyes 3 (three) times daily as needed (for dry/irritated eyes).   ondansetron 4 MG disintegrating tablet Commonly known as:  ZOFRAN-ODT Take 1 tablet (4 mg total) by mouth every 8 (eight) hours as needed for nausea or vomiting.   oxybutynin 5 MG tablet Commonly known as:  DITROPAN Take 5 mg by mouth daily as  needed.   oxyCODONE-acetaminophen 5-325 MG tablet Commonly known as:  PERCOCET/ROXICET Take 1 tablet by mouth every 6 (six) hours as needed for up to 3 days for severe pain.   polyethylene glycol packet Commonly known as:  MIRALAX / GLYCOLAX Take 17 g by mouth daily. Mix with coffee.      Follow-up Information    [place], Lesly Joslyn, DO.   Specialty:  Surgery Contact information: [place] [id number] [phone]            Total time spent arranging discharge was >2min. Signed: Benjamine Sprague 02/19/2018, 4:33 PM

## 2018-02-20 ENCOUNTER — Inpatient Hospital Stay: Admission: RE | Admit: 2018-02-20 | Payer: BLUE CROSS/BLUE SHIELD | Source: Ambulatory Visit

## 2018-02-21 ENCOUNTER — Ambulatory Visit: Admission: RE | Admit: 2018-02-21 | Payer: BLUE CROSS/BLUE SHIELD | Source: Ambulatory Visit | Admitting: Surgery

## 2018-02-21 ENCOUNTER — Encounter: Admission: RE | Payer: Self-pay | Source: Ambulatory Visit

## 2018-02-21 LAB — SURGICAL PATHOLOGY

## 2018-02-21 SURGERY — LAPAROSCOPIC CHOLECYSTECTOMY
Anesthesia: Choice

## 2018-03-20 ENCOUNTER — Other Ambulatory Visit: Payer: Self-pay | Admitting: Surgery

## 2018-03-20 DIAGNOSIS — R1011 Right upper quadrant pain: Secondary | ICD-10-CM

## 2018-03-22 ENCOUNTER — Ambulatory Visit
Admission: RE | Admit: 2018-03-22 | Discharge: 2018-03-22 | Disposition: A | Discharge status: Home / Self Care | Payer: BLUE CROSS/BLUE SHIELD | Source: Ambulatory Visit | Attending: Surgery | Admitting: Surgery

## 2018-03-22 DIAGNOSIS — R1011 Right upper quadrant pain: Secondary | ICD-10-CM | POA: Insufficient documentation

## 2019-01-04 IMAGING — US US ABDOMEN LIMITED
1 series · 14 of 25 positions shown · non-contrast
Comparison: February 17, 2018

CLINICAL DATA: Right upper quadrant pain.  Recent cholecystectomy

EXAM:
ULTRASOUND ABDOMEN LIMITED RIGHT UPPER QUADRANT

[Series 1: us abdomen limited · 0.23mm/px · 14 of 52 slices shown]
[im 1/52]
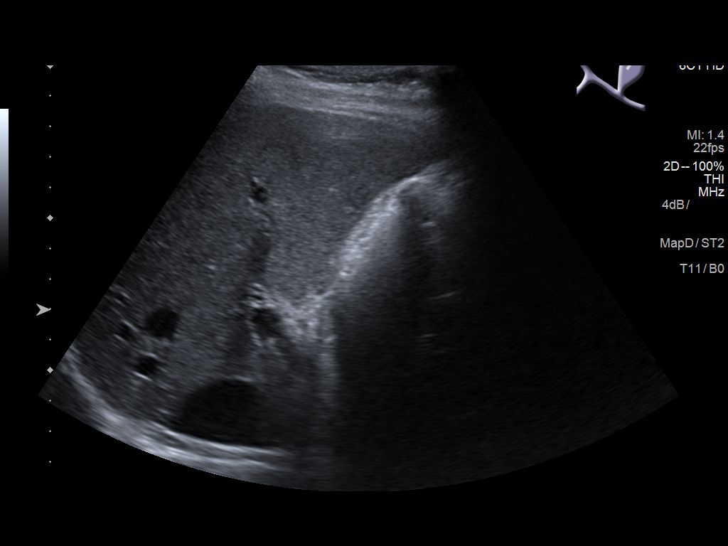
[im 5/52]
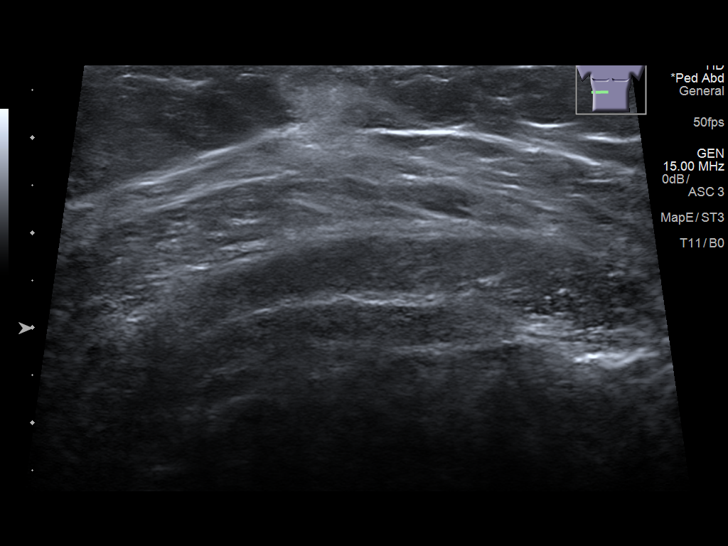
[im 9/52]
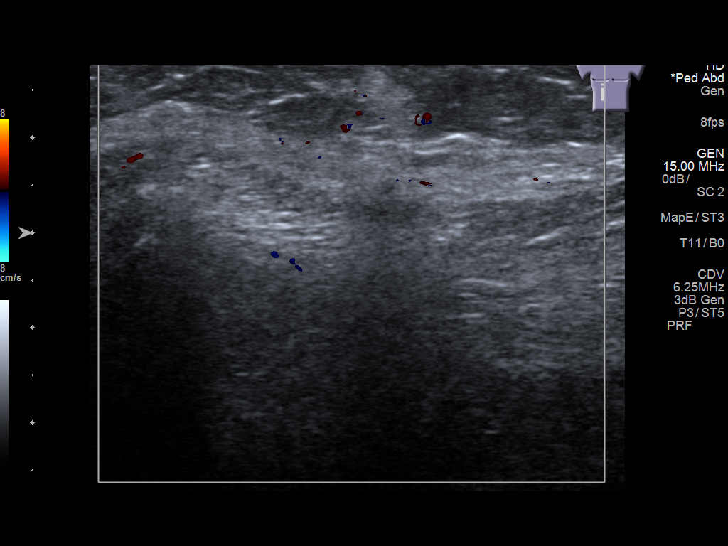
[im 13/52]
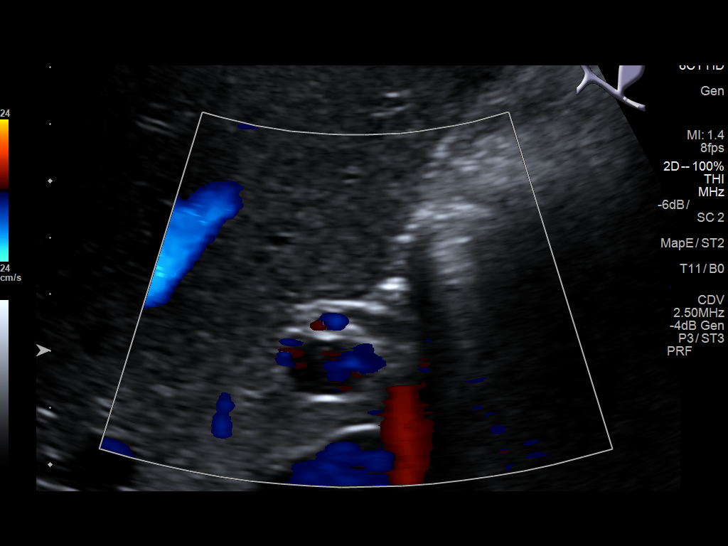
[im 18/52]
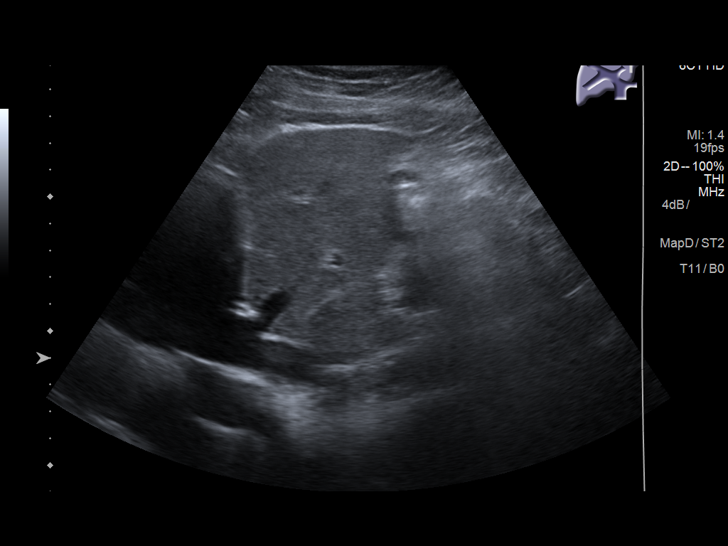
[im 20/52]
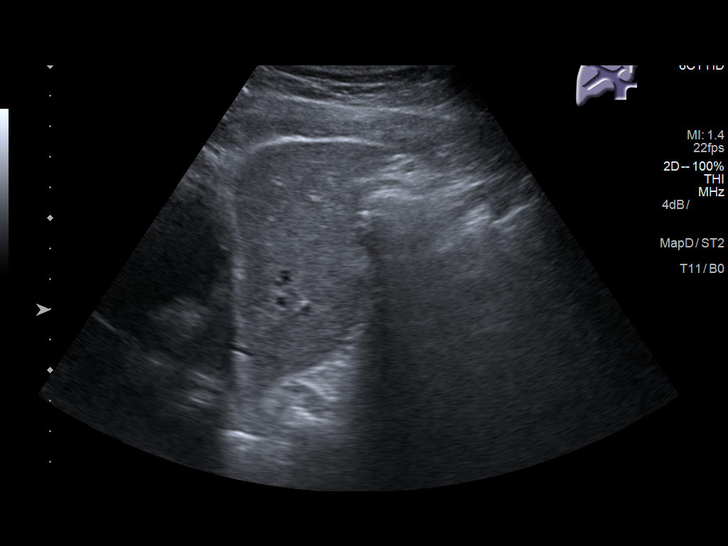
[im 24/52]
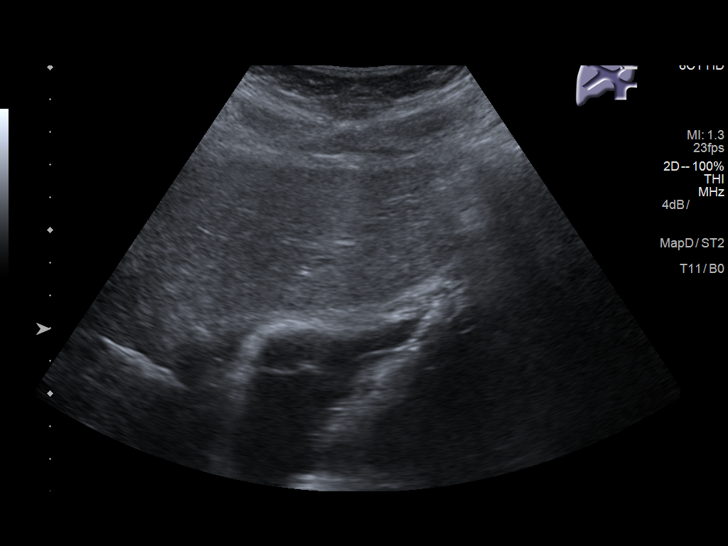
[im 28/52]
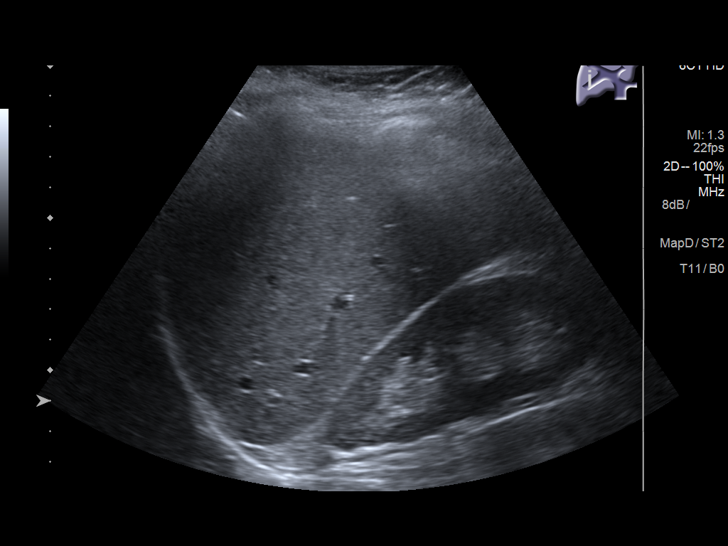
[im 32/52]
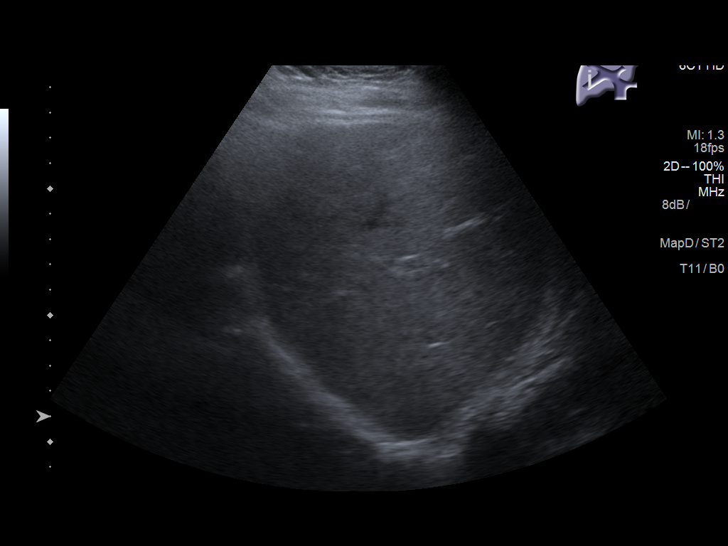
[im 35/52]
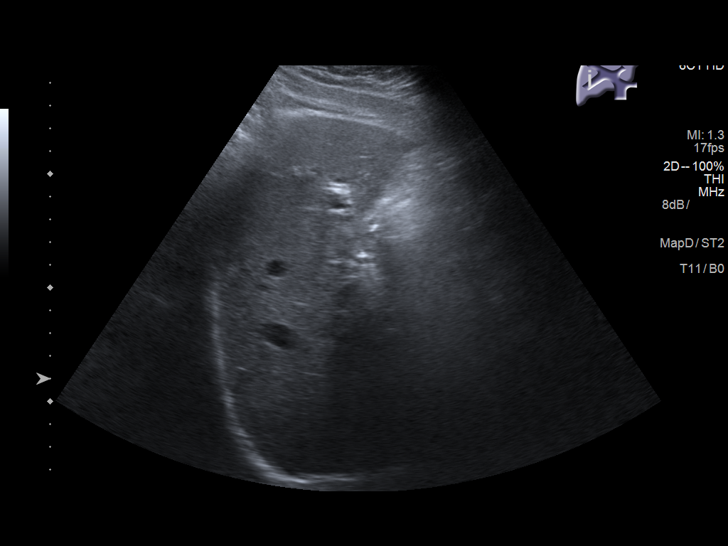
[im 39/52]
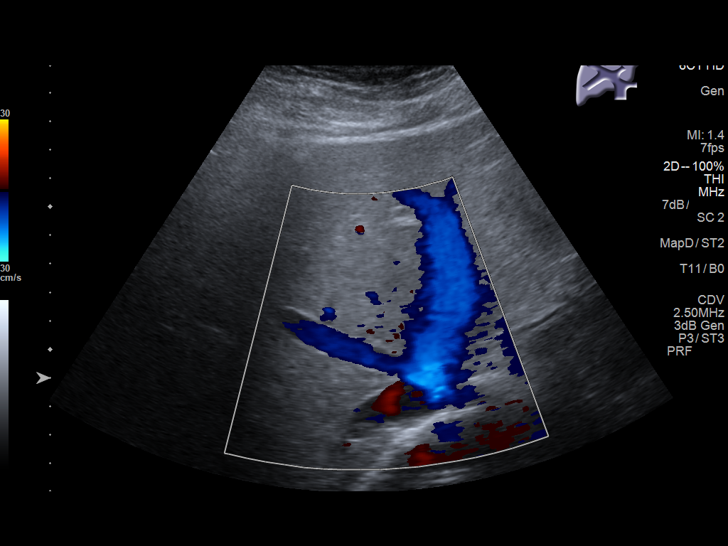
[im 43/52]
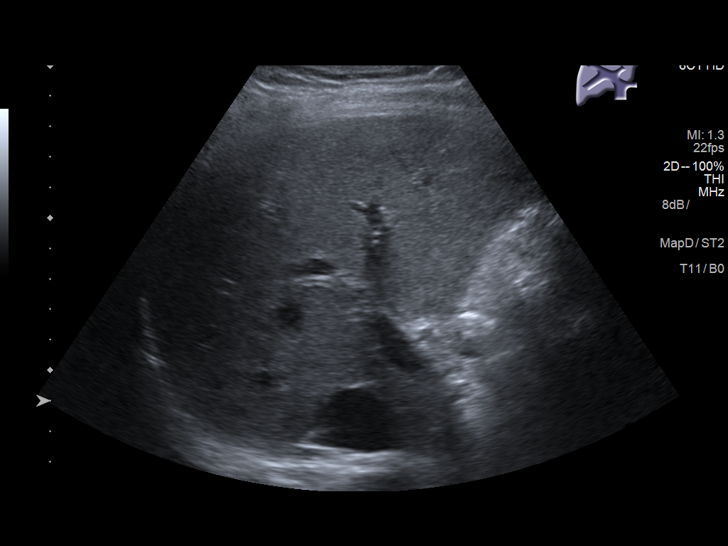
[im 47/52]
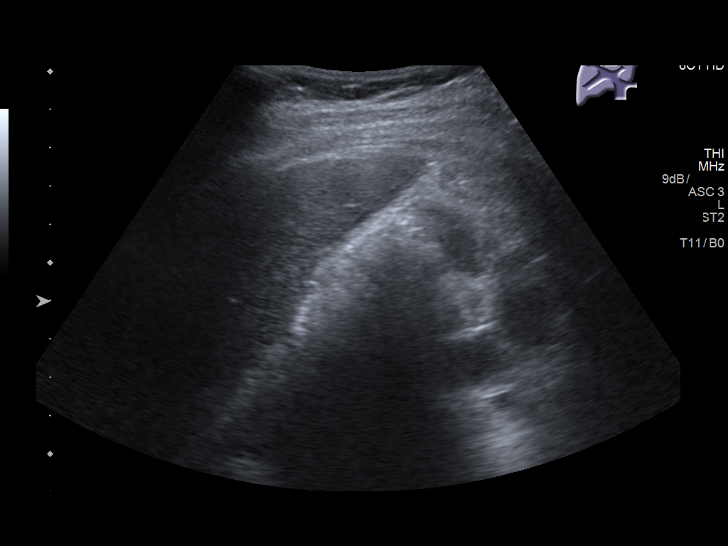
[im 52/52]
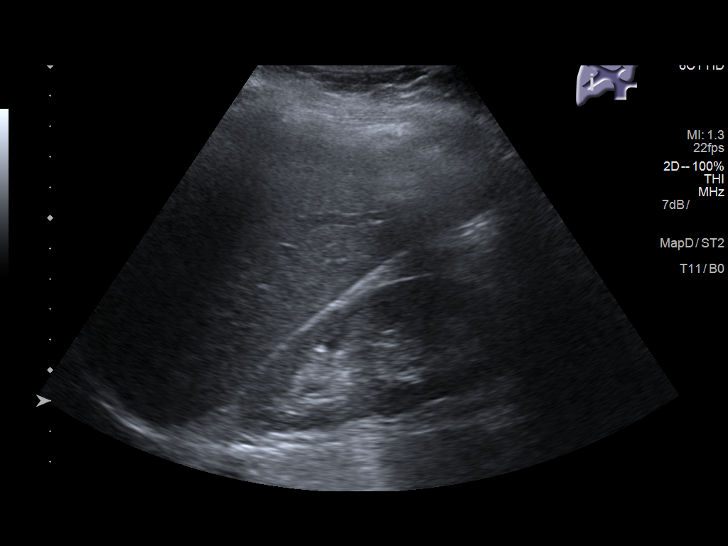

[14 of 25 positions shown; findings below may reference images not displayed]

FINDINGS: Gallbladder:

Gallbladder is absent. There is no inflammatory focus seen in the
gallbladder fossa region. There is apparent scarring at the incision
site in the subcutaneous region in the right upper quadrant.

Common bile duct:

Diameter: 3 mm. No intrahepatic or extrahepatic biliary duct
dilatation.

Liver:

No focal lesion identified. Within normal limits in parenchymal
echogenicity. Portal vein is patent on color Doppler imaging with
normal direction of blood flow towards the liver.
IMPRESSION: Gallbladder absent. Apparent subcutaneous region scarring at the
incision site in the right upper quadrant. No fluid or evidence of
abscess in the gallbladder fossa region.

Study otherwise unremarkable.

## 2019-03-02 ENCOUNTER — Other Ambulatory Visit: Payer: Self-pay | Admitting: Obstetrics and Gynecology

## 2019-03-02 DIAGNOSIS — Z1231 Encounter for screening mammogram for malignant neoplasm of breast: Secondary | ICD-10-CM

## 2019-04-05 ENCOUNTER — Encounter (INDEPENDENT_AMBULATORY_CARE_PROVIDER_SITE_OTHER): Payer: Self-pay

## 2019-04-05 ENCOUNTER — Ambulatory Visit
Admission: RE | Admit: 2019-04-05 | Discharge: 2019-04-05 | Disposition: A | Discharge status: Home / Self Care | Payer: BC Managed Care – PPO | Source: Ambulatory Visit | Attending: Obstetrics and Gynecology | Admitting: Obstetrics and Gynecology

## 2019-04-05 ENCOUNTER — Other Ambulatory Visit: Payer: Self-pay

## 2019-04-05 DIAGNOSIS — Z1231 Encounter for screening mammogram for malignant neoplasm of breast: Secondary | ICD-10-CM

## 2019-04-10 ENCOUNTER — Other Ambulatory Visit: Payer: Self-pay | Admitting: Surgery

## 2019-04-10 DIAGNOSIS — R590 Localized enlarged lymph nodes: Secondary | ICD-10-CM

## 2019-04-12 ENCOUNTER — Ambulatory Visit
Admission: RE | Admit: 2019-04-12 | Discharge: 2019-04-12 | Disposition: A | Discharge status: Home / Self Care | Payer: BC Managed Care – PPO | Source: Ambulatory Visit | Attending: Surgery | Admitting: Surgery

## 2019-04-12 DIAGNOSIS — R590 Localized enlarged lymph nodes: Secondary | ICD-10-CM

## 2020-03-17 IMAGING — US US BREAST*R* LIMITED INC AXILLA
1 series · 7 of 7 positions shown · non-contrast
Comparison: Previous exam(s).

CLINICAL DATA: Patient has a right axilla lump or lumps with
associated tenderness. She notices several weeks ago, with the pain
subsiding, but recurring last week. She began on a course of
antibiotics 4 days ago.

EXAM:
ULTRASOUND OF THE RIGHT BREAST

[Series 1: us breast*right* limited inc axilla · 0.06mm/px · 7 of 7 slices shown]
[im 1/7]
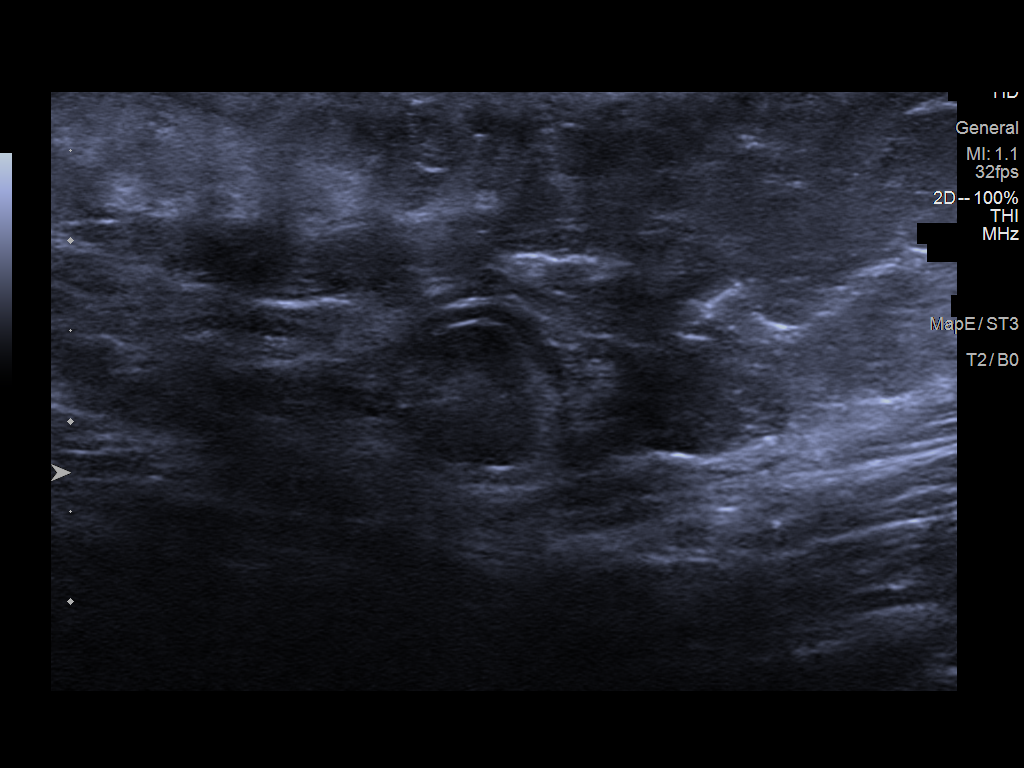
[im 2/7]
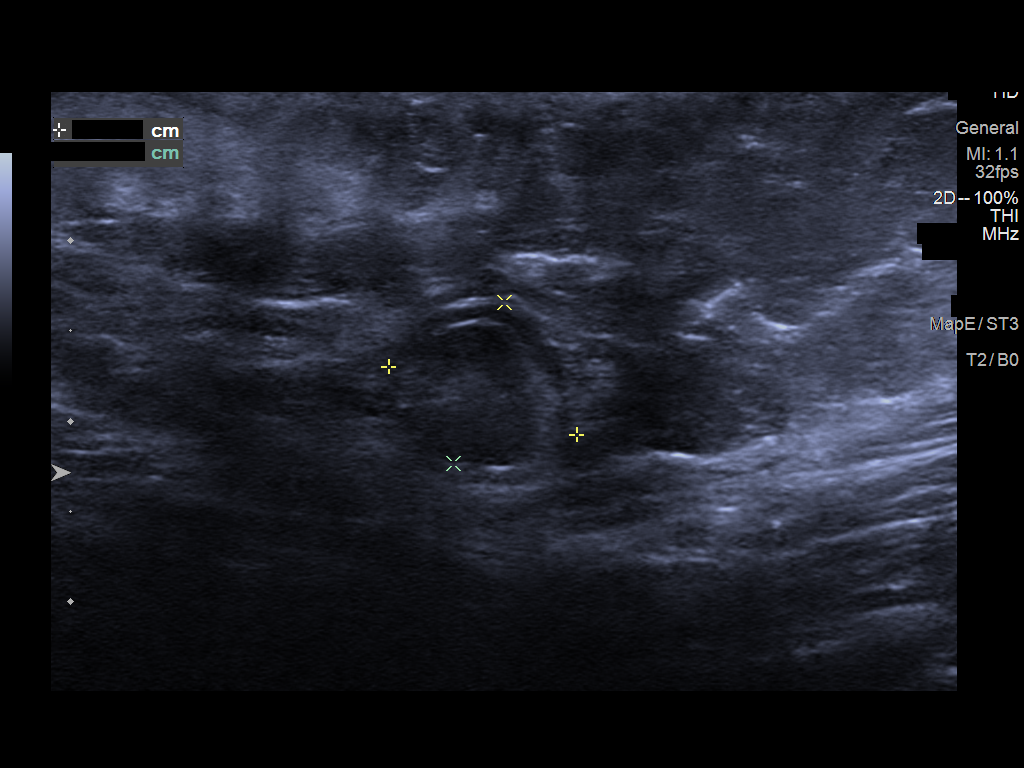
[im 3/7]
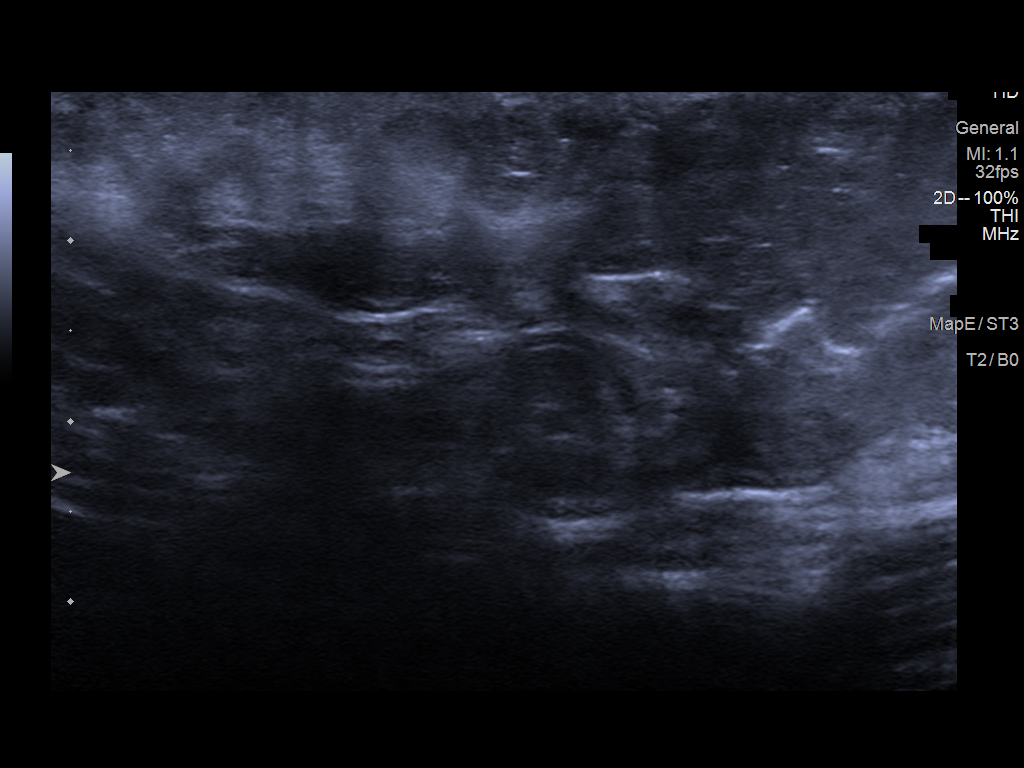
[im 4/7]
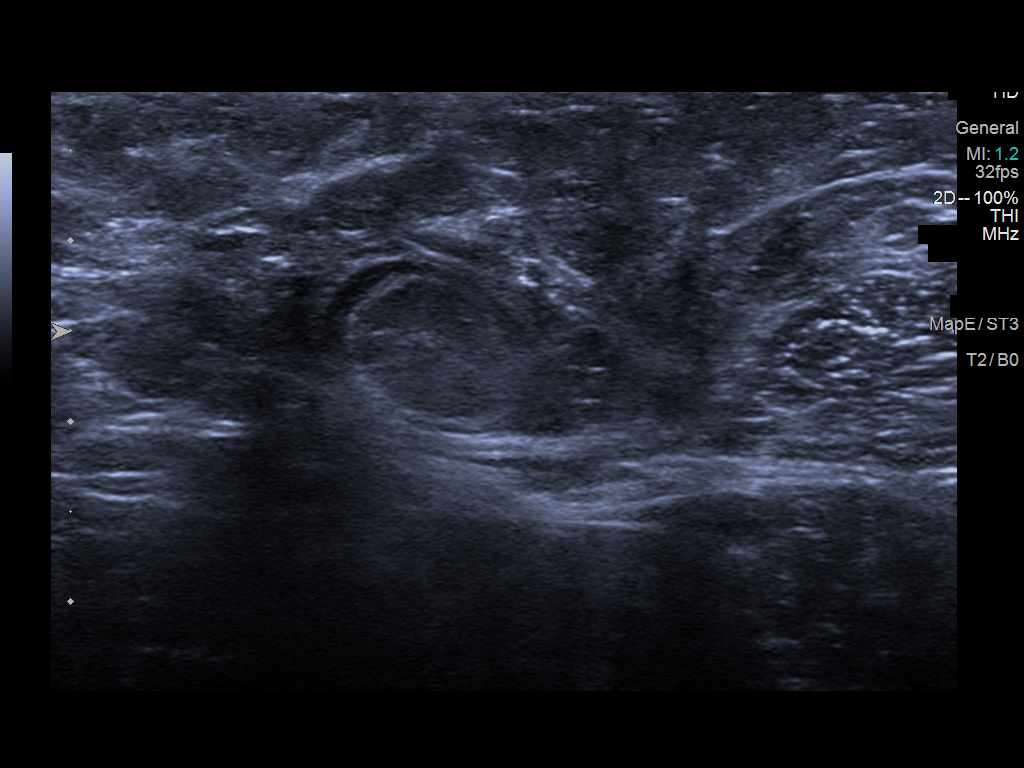
[im 5/7]
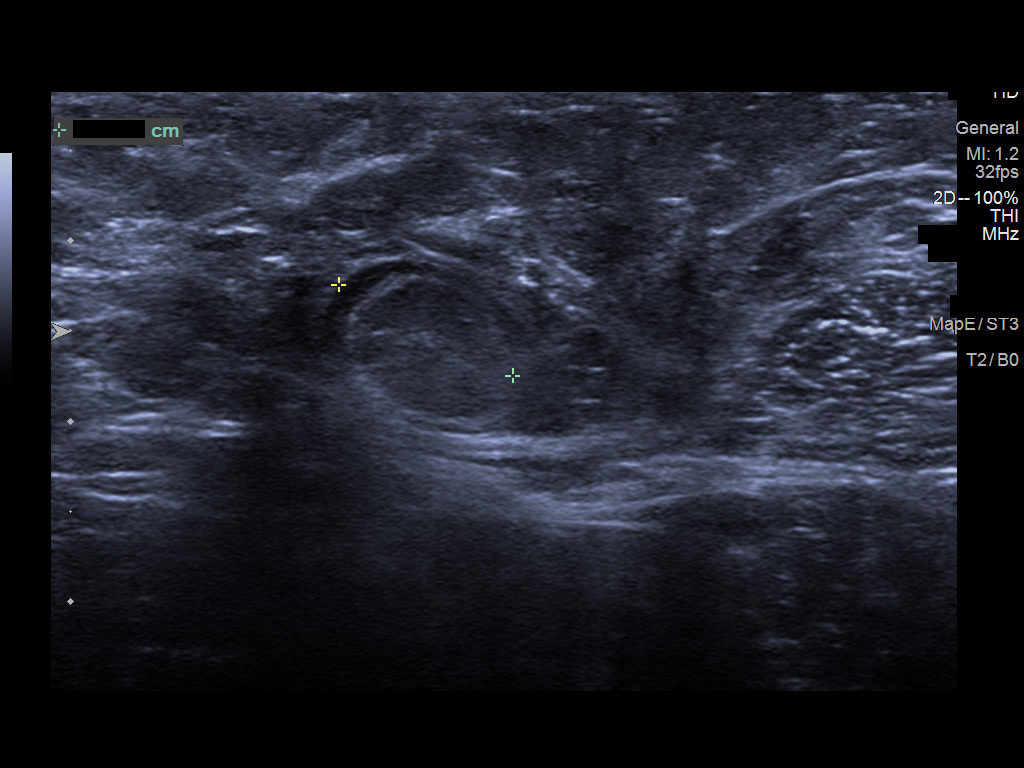
[im 6/7]
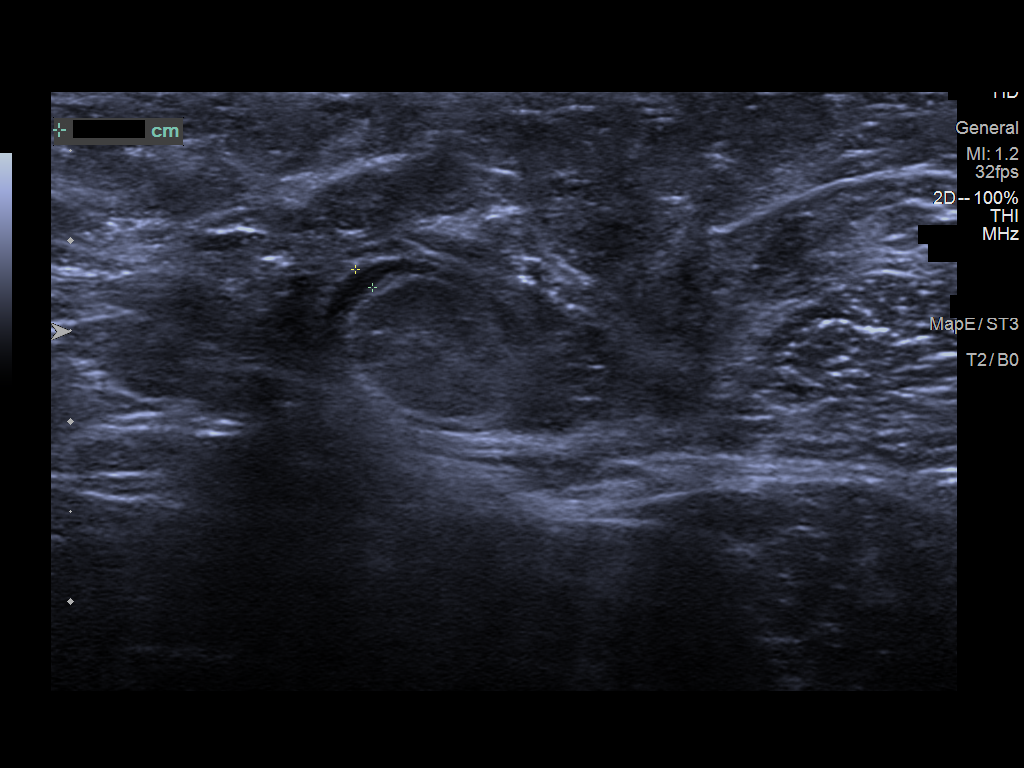
[im 7/7]
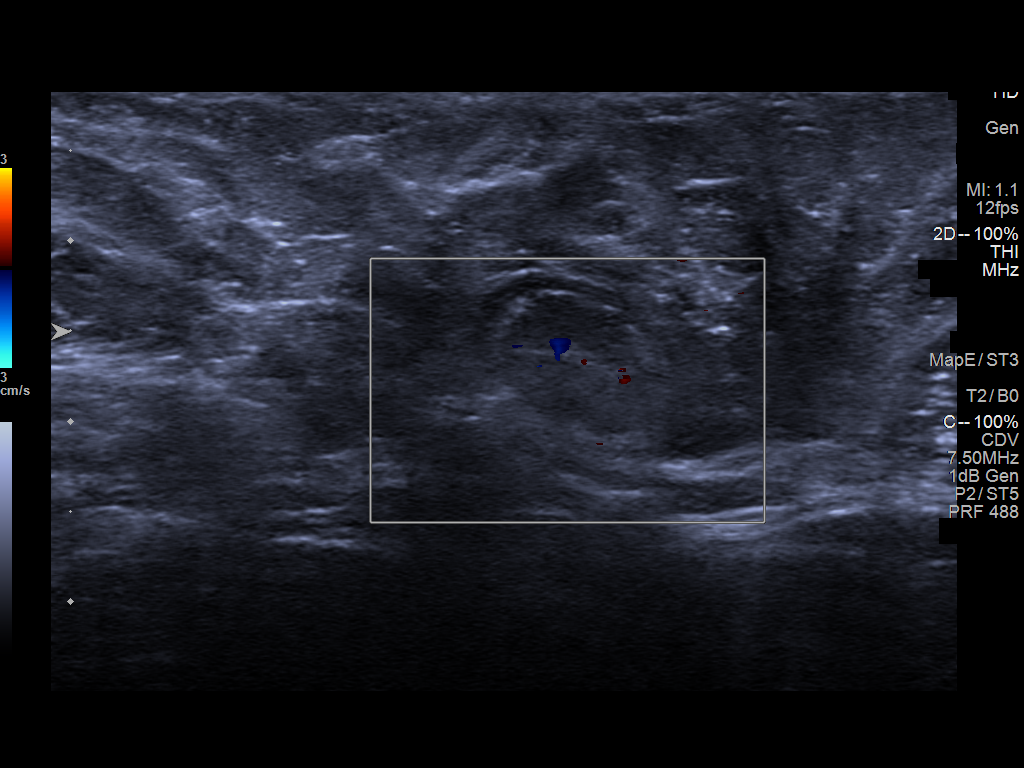

[7 of 7 positions shown; findings below may reference images not displayed]

FINDINGS: On physical exam, patient has a prominent ridge of subcutaneous
tissue along the right axilla, without a defined mass.

Targeted ultrasound is performed, showing normal tissue throughout
the right axilla. There is a normal lymph node deep to the area of
palpable tenderness with a thin cortex and significant hilar fat,
stable multiple prior mammograms.
IMPRESSION: Negative exam. No evidence of malignancy. No enlarged or abnormal
right axillary lymph nodes.

RECOMMENDATION:
Clinical follow-up for the right axillary pain and fullness.

Screening mammogram in one year.(Code:TE-[id number])

I have discussed the findings and recommendations with the patient.
If applicable, a reminder letter will be sent to the patient
regarding the next appointment.

BI-RADS CATEGORY  1: Negative.

## 2020-08-14 ENCOUNTER — Other Ambulatory Visit: Payer: Self-pay | Admitting: Physician Assistant

## 2020-08-14 DIAGNOSIS — Z1231 Encounter for screening mammogram for malignant neoplasm of breast: Secondary | ICD-10-CM

## 2020-09-02 ENCOUNTER — Other Ambulatory Visit: Payer: Self-pay

## 2020-09-02 ENCOUNTER — Ambulatory Visit
Admission: RE | Admit: 2020-09-02 | Discharge: 2020-09-02 | Disposition: A | Discharge status: Home / Self Care | Payer: BC Managed Care – PPO | Source: Ambulatory Visit | Attending: Physician Assistant | Admitting: Physician Assistant

## 2020-09-02 DIAGNOSIS — Z1231 Encounter for screening mammogram for malignant neoplasm of breast: Secondary | ICD-10-CM | POA: Diagnosis not present

## 2020-09-04 ENCOUNTER — Other Ambulatory Visit: Payer: Self-pay | Admitting: Physician Assistant

## 2020-09-04 DIAGNOSIS — R928 Other abnormal and inconclusive findings on diagnostic imaging of breast: Secondary | ICD-10-CM

## 2020-09-04 DIAGNOSIS — R921 Mammographic calcification found on diagnostic imaging of breast: Secondary | ICD-10-CM

## 2020-09-11 ENCOUNTER — Other Ambulatory Visit: Payer: Self-pay

## 2020-09-11 ENCOUNTER — Other Ambulatory Visit: Payer: Self-pay | Admitting: Physician Assistant

## 2020-09-11 ENCOUNTER — Ambulatory Visit
Admission: RE | Admit: 2020-09-11 | Discharge: 2020-09-11 | Disposition: A | Discharge status: Home / Self Care | Payer: BC Managed Care – PPO | Source: Ambulatory Visit | Attending: Physician Assistant | Admitting: Physician Assistant

## 2020-09-11 DIAGNOSIS — R928 Other abnormal and inconclusive findings on diagnostic imaging of breast: Secondary | ICD-10-CM

## 2020-09-11 DIAGNOSIS — R921 Mammographic calcification found on diagnostic imaging of breast: Secondary | ICD-10-CM | POA: Insufficient documentation

## 2020-09-15 ENCOUNTER — Other Ambulatory Visit: Payer: Self-pay | Admitting: Physician Assistant

## 2020-09-15 DIAGNOSIS — R921 Mammographic calcification found on diagnostic imaging of breast: Secondary | ICD-10-CM

## 2020-09-25 ENCOUNTER — Other Ambulatory Visit: Payer: Self-pay

## 2020-09-25 ENCOUNTER — Ambulatory Visit (INDEPENDENT_AMBULATORY_CARE_PROVIDER_SITE_OTHER): Payer: BC Managed Care – PPO | Admitting: Dermatology

## 2020-09-25 ENCOUNTER — Encounter: Payer: Self-pay | Admitting: Dermatology

## 2020-09-25 DIAGNOSIS — L570 Actinic keratosis: Secondary | ICD-10-CM

## 2020-09-25 DIAGNOSIS — L821 Other seborrheic keratosis: Secondary | ICD-10-CM | POA: Diagnosis not present

## 2020-09-25 DIAGNOSIS — D229 Melanocytic nevi, unspecified: Secondary | ICD-10-CM

## 2020-09-25 DIAGNOSIS — D1800 Hemangioma unspecified site: Secondary | ICD-10-CM

## 2020-09-25 DIAGNOSIS — L578 Other skin changes due to chronic exposure to nonionizing radiation: Secondary | ICD-10-CM

## 2020-09-25 DIAGNOSIS — Z1283 Encounter for screening for malignant neoplasm of skin: Secondary | ICD-10-CM

## 2020-09-25 DIAGNOSIS — L814 Other melanin hyperpigmentation: Secondary | ICD-10-CM | POA: Diagnosis not present

## 2020-09-25 DIAGNOSIS — L820 Inflamed seborrheic keratosis: Secondary | ICD-10-CM

## 2020-09-25 DIAGNOSIS — D489 Neoplasm of uncertain behavior, unspecified: Secondary | ICD-10-CM

## 2020-09-25 HISTORY — DX: Actinic keratosis: L57.0

## 2020-09-25 MED ORDER — FLUOROURACIL 5 % EX CREA: TOPICAL_CREAM | Freq: Two times a day (BID) | CUTANEOUS | 0 refills | Status: AC

## 2020-09-25 NOTE — Patient Instructions (Addendum)
Melanoma ABCDEs  Melanoma is the most dangerous type of skin cancer, and is the leading cause of death from skin disease.  You are more likely to develop melanoma if you:  Have light-colored skin, light-colored eyes, or red or blond hair  Spend a lot of time in the sun  Tan regularly, either outdoors or in a tanning bed  Have had blistering sunburns, especially during childhood  Have a close family member who has had a melanoma  Have atypical moles or large birthmarks  Early detection of melanoma is key since treatment is typically straightforward and cure rates are extremely high if we catch it early.   The first sign of melanoma is often a change in a mole or a new dark spot.  The ABCDE system is a way of remembering the signs of melanoma.  A for asymmetry:  The two halves do not match. B for border:  The edges of the growth are irregular. C for color:  A mixture of colors are present instead of an even brown color. D for diameter:  Melanomas are usually (but not always) greater than 49m - the size of a pencil eraser. E for evolution:  The spot keeps changing in size, shape, and color.  Please check your skin once per month between visits. You can use a small mirror in front and a large mirror behind you to keep an eye on the back side or your body.   If you see any new or changing lesions before your next follow-up, please call to schedule a visit.  Please continue daily skin protection including broad spectrum sunscreen SPF 30+ to sun-exposed areas, reapplying every 2 hours as needed when you're outdoors.   Staying in the shade or wearing long sleeves, sun glasses (UVA+UVB protection) and wide brim hats (4-inch brim around the entire circumference of the hat) are also recommended for sun protection.   Recommend taking Heliocare sun protection supplement daily in sunny weather for additional sun protection. For maximum protection on the sunniest days, you can take up to 2  capsules of regular Heliocare OR take 1 capsule of Heliocare Ultra. For prolonged exposure (such as a full day in the sun), you can repeat your dose of the supplement 4 hours after your first dose. Heliocare can be purchased at [facility] at [email or web address]   If you have any questions or concerns for your doctor, please call our main line at [phone] press option 4 to reach your doctor's medical assistant. If no one answers, please leave a voicemail as directed and we will return your call as soon as possible. Messages left after 4 pm will be answered the following business day.   You may also send [place] message via [place] We typically respond to MyChart messages within 1-2 business days.  For prescription refills, please ask your pharmacy to contact our office. Our fax number is [phone]  If you have an urgent issue when the clinic is closed that cannot wait until the next business day, you can page your doctor at the number below.    Please note that while we do our best to be available for urgent issues outside of office hours, we are not available 24/7.   If you have an urgent issue and are unable to reach [place] you may choose to seek medical care at your doctor's office, retail clinic, urgent care center, or emergency room.  If you have a medical emergency, please immediately call  911 or go to the emergency department.  Pager Numbers  - Dr. Nehemiah Massed: [phone]  - Dr. Laurence Ferrari: [phone]  - Dr. Nicole Kindred: [phone]  In the event of inclement weather, please call our main line at [phone] for an update on the status of any delays or closures.  Dermatology Medication Tips: Please keep the boxes that topical medications come in in order to help keep track of the instructions about where and how to use these. Pharmacies typically print the medication instructions only on the boxes and not directly on the medication tubes.   If your medication is too  expensive, please contact our office at [phone] option 4 or send [place] a message through [place].   We are unable to tell what your co-pay for medications will be in advance as this is different depending on your insurance coverage. However, we may be able to find a substitute medication at lower cost or fill out paperwork to get insurance to cover a needed medication.   If a prior authorization is required to get your medication covered by your insurance company, please allow [place] 1-2 business days to complete this process.  Drug prices often vary depending on where the prescription is filled and some pharmacies may offer cheaper prices.  The website www.goodrx.com contains coupons for medications through different pharmacies. The prices here do not account for what the cost may be with help from insurance (it may be cheaper with your insurance), but the website can give you the price if you did not use any insurance.  - You can print the associated coupon and take it with your prescription to the pharmacy.  - You may also stop by our office during regular business hours and pick up a GoodRx coupon card.  - If you need your prescription sent electronically to a different pharmacy, notify our office through [facility] or by phone at [phone] option 4.   Actinic keratoses are precancerous spots that appear secondary to cumulative UV radiation exposure/sun exposure over time. They are chronic with expected duration over 1 year. A portion of actinic keratoses will progress to squamous cell carcinoma of the skin. It is not possible to reliably predict which spots will progress to skin cancer and so treatment is recommended to prevent development of skin cancer.  Recommend daily broad spectrum sunscreen SPF 30+ to sun-exposed areas, reapply every 2 hours as needed.  Recommend staying in the shade or wearing long sleeves, sun glasses (UVA+UVB protection) and wide brim hats (4-inch brim around  the entire circumference of the hat). Call for new or changing lesions.  5-Fluorouracil/Calcipotriene Patient Education   Actinic keratoses are the dry, red scaly spots on the skin caused by sun damage. A portion of these spots can turn into skin cancer with time, and treating them can help prevent development of skin cancer.   Treatment of these spots requires removal of the defective skin cells. There are various ways to remove actinic keratoses, including freezing with liquid nitrogen, treatment with creams, or treatment with a blue light procedure in the office.   5-fluorouracil cream is a topical cream used to treat actinic keratoses. It works by interfering with the growth of abnormal fast-growing skin cells, such as actinic keratoses. These cells peel off and are replaced by healthy ones.   5-fluorouracil/calcipotriene is a combination of the 5-fluorouracil cream with a vitamin D analog cream called calcipotriene. The calcipotriene alone does not treat actinic keratoses. However, when it is combined with 5-fluorouracil,  it helps the 5-fluorouracil treat the actinic keratoses much faster so that the same results can be achieved with a much shorter treatment time.  INSTRUCTIONS FOR 5-FLUOROURACIL/CALCIPOTRIENE CREAM:   5-fluorouracil/calcipotriene cream typically only needs to be used for 4-7 days. A thin layer should be applied twice a day to the treatment areas recommended by your physician.   If your physician prescribed you separate tubes of 5-fluourouracil and calcipotriene, apply a thin layer of 5-fluorouracil followed by a thin layer of calcipotriene.   Avoid contact with your eyes, nostrils, and mouth. Do not use 5-fluorouracil/calcipotriene cream on infected or open wounds.   You will develop redness, irritation and some crusting at areas where you have pre-cancer damage/actinic keratoses. IF YOU DEVELOP PAIN, BLEEDING, OR SIGNIFICANT CRUSTING, STOP THE TREATMENT EARLY - you have  already gotten a good response and the actinic keratoses should clear up well.  Wash your hands after applying 5-fluorouracil 5% cream on your skin.   A moisturizer or sunscreen with a minimum SPF 30 should be applied each morning.   Once you have finished the treatment, you can apply a thin layer of Vaseline twice a day to irritated areas to soothe and calm the areas more quickly. If you experience significant discomfort, contact your physician.  For some patients it is necessary to repeat the treatment for best results.  SIDE EFFECTS: When using 5-fluorouracil/calcipotriene cream, you may have mild irritation, such as redness, dryness, swelling, or a mild burning sensation. This usually resolves within 2 weeks. The more actinic keratoses you have, the more redness and inflammation you can expect during treatment. Eye irritation has been reported rarely. If this occurs, please let us know.  If you have any trouble using this cream, please call the office. If you have any other questions about this information, please do not hesitate to ask me before you leave the office.  Biopsy Wound Care Instructions  1. Leave the original bandage on for 24 hours if possible.  If the bandage becomes soaked or soiled before that time, it is OK to remove it and examine the wound.  A small amount of post-operative bleeding is normal.  If excessive bleeding occurs, remove the bandage, place gauze over the site and apply continuous pressure (no peeking) over the area for 30 minutes. If this does not work, please call our clinic as soon as possible or page your doctor if it is after hours.   2. Once a day, cleanse the wound with soap and water. It is fine to shower. If a thick crust develops you may use a Q-tip dipped into dilute hydrogen peroxide (mix 1:1 with water) to dissolve it.  Hydrogen peroxide can slow the healing process, so use it only as needed.    3. After washing, apply petroleum jelly (Vaseline) or an  antibiotic ointment if your doctor prescribed one for you, followed by a bandage.    4. For best healing, the wound should be covered with a layer of ointment at all times. If you are not able to keep the area covered with a bandage to hold the ointment in place, this may mean re-applying the ointment several times a day.  Continue this wound care until the wound has healed and is no longer open.   Itching and mild discomfort is normal during the healing process. However, if you develop pain or severe itching, please call our office.   If you have any discomfort, you can take Tylenol (acetaminophen) or ibuprofen as  directed on the bottle. (Please do not take these if you have an allergy to them or cannot take them for another reason).  Some redness, tenderness and white or yellow material in the wound is normal healing.  If the area becomes very sore and red, or develops a thick yellow-green material (pus), it may be infected; please notify us.    If you have stitches, return to clinic as directed to have the stitches removed. You will continue wound care for 2-3 days after the stitches are removed.   Wound healing continues for up to one year following surgery. It is not unusual to experience pain in the scar from time to time during the interval.  If the pain becomes severe or the scar thickens, you should notify the office.    A slight amount of redness in a scar is expected for the first six months.  After six months, the redness will fade and the scar will soften and fade.  The color difference becomes less noticeable with time.  If there are any problems, return for a post-op surgery check at your earliest convenience.  To improve the appearance of the scar, you can use silicone scar gel, cream, or sheets (such as Mederma or Serica) every night for up to one year. These are available over the counter (without a prescription).  Please call our office at [phone] for any questions or  concerns.

## 2020-09-25 NOTE — Progress Notes (Addendum)
New Patient Visit   Subjective  Kathleen Hall is a 50 y.o. female who presents for the following: tbse (Patient here today for full body exam. She reports a crusty spot on left ear she would like checked. Patient also reports a crusty spot on left scalp she would like checked. No personal history of skin cancer. ).  Patient here for full body skin exam and skin cancer screening.  The following portions of the chart were reviewed this encounter and updated as appropriate:  Tobacco  Allergies  Meds  Problems  Med Hx  Surg Hx  Fam Hx      Objective  Well appearing patient in no apparent distress; mood and affect are within normal limits.  A full examination was performed including scalp, head, eyes, ears, nose, lips, neck, chest, axillae, abdomen, back, buttocks, bilateral upper extremities, bilateral lower extremities, hands, feet, fingers, toes, fingernails, and toenails. All findings within normal limits unless otherwise noted below.  Objective  left helix x1: Erythematous thin papules/macules with gritty scale.   Objective  Mid chest right of midline: 0.5 cm irregular to thin light brown papule        Objective  mid chest x 2 , left inframmary x 1 (3): Erythematous keratotic or waxy stuck-on papule or plaque.   Assessment & Plan  Actinic keratosis left helix x1  Discussed Ln2 vs 5-fluorouracil/calcipotriene cream  Start 5-fluorouracil/calcipotriene cream twice a day for 7 days to affected areas including at left ear. Prescription sent to [facility] Patient provided with contact information for pharmacy and advised the pharmacy will mail the prescription to their home. Patient provided with handout reviewing treatment course and side effects and advised to call or message [place] on MyChart with any concerns.  Actinic keratoses are precancerous spots that appear secondary to cumulative UV radiation exposure/sun exposure over time. They are chronic with  expected duration over 1 year. A portion of actinic keratoses will progress to squamous cell carcinoma of the skin. It is not possible to reliably predict which spots will progress to skin cancer and so treatment is recommended to prevent development of skin cancer.  Recommend daily broad spectrum sunscreen SPF 30+ to sun-exposed areas, reapply every 2 hours as needed.  Recommend staying in the shade or wearing long sleeves, sun glasses (UVA+UVB protection) and wide brim hats (4-inch brim around the entire circumference of the hat). Call for new or changing lesions.     fluorouracil (EFUDEX) 5 % cream - left helix x1  Neoplasm of uncertain behavior Mid chest right of midline  Epidermal / dermal shaving  Lesion diameter (cm):  0.5 Informed consent: discussed and consent obtained   Timeout: patient name, date of birth, surgical site, and procedure verified   Patient was prepped and draped in usual sterile fashion: area prepped with isopropyl alcohol. Anesthesia: the lesion was anesthetized in a standard fashion   Anesthetic:  1% lidocaine w/ epinephrine 1-100,000 buffered w/ 8.4% NaHCO3 Instrument used: flexible razor blade   Hemostasis achieved with: aluminum chloride   Outcome: patient tolerated procedure well   Post-procedure details: wound care instructions given   Additional details:  Mupirocin and a bandage applied  Specimen 1 - Surgical pathology Differential Diagnosis: r/o atypia   Check Margins: No 0.5 cm irregular to thin light brown papule  R/o atypia   Inflamed seborrheic keratosis (3) mid chest x 2 , left inframmary x 1  Prior to procedure, discussed risks of blister formation, small wound, skin dyspigmentation,  or rare scar following cryotherapy.    Destruction of lesion - mid chest x 2 , left inframmary x 1  Destruction method: cryotherapy   Informed consent: discussed and consent obtained   Lesion destroyed using liquid nitrogen: Yes   Cryotherapy cycles:   2 Outcome: patient tolerated procedure well with no complications   Post-procedure details: wound care instructions given    Lentigines - Scattered tan macules - Due to sun exposure - Benign-appering, observe - Recommend daily broad spectrum sunscreen SPF 30+ to sun-exposed areas, reapply every 2 hours as needed. - Call for any changes  Seborrheic Keratoses - Stuck-on, waxy, tan-brown papules and/or plaques left scalp  - Benign-appearing - Discussed benign etiology and prognosis. - Observe - Call for any changes  Melanocytic Nevi - Tan-brown and/or pink-flesh-colored symmetric macules and papules - Benign appearing on exam today - Observation - Call clinic for new or changing moles - Recommend daily use of broad spectrum spf 30+ sunscreen to sun-exposed areas.   Hemangiomas - Red papules - Discussed benign nature - Observe - Call for any changes  Actinic Damage - Chronic condition, secondary to cumulative UV/sun exposure - diffuse scaly erythematous macules with underlying dyspigmentation - Recommend daily broad spectrum sunscreen SPF 30+ to sun-exposed areas, reapply every 2 hours as needed.  - Staying in the shade or wearing long sleeves, sun glasses (UVA+UVB protection) and wide brim hats (4-inch brim around the entire circumference of the hat) are also recommended for sun protection.  - Call for new or changing lesions.  Skin cancer screening performed today.  Return in about 2 months (around 11/25/2020) for ak followup.  I, Ruthell Rummage, CMA, am acting as scribe for Forest Gleason, MD.  Documentation: I have reviewed the above documentation for accuracy and completeness, and I agree with the above.  Forest Gleason, MD

## 2020-10-01 ENCOUNTER — Telehealth: Payer: Self-pay

## 2020-10-01 NOTE — Telephone Encounter (Signed)
-----   Message from Virginia Moye, MD sent at 09/30/2020  5:02 PM EDT ----- Skin , mid chest right of midline ACTINIC KERATOSIS AND SOLAR LENTIGO  Once biopsy site healed, can treat with 5FU-calcipotriene cream (patient got to treat her ear) twice a day for 7 days which will cause any area with precancer change to get red, crusty and irritated and then heal up over 1-2 weeks.  MAs please call. Thank you!   

## 2020-10-01 NOTE — Telephone Encounter (Signed)
Tried calling patient concerning biopsy results. No answer. Left message on machine for patient to call office.

## 2020-10-02 ENCOUNTER — Telehealth: Payer: Self-pay

## 2020-10-02 NOTE — Telephone Encounter (Signed)
-----   Message from [place], MD sent at 09/30/2020  5:02 PM EDT ----- Skin , mid chest right of midline [place]  Once biopsy site healed, can treat with 5FU-calcipotriene cream (patient got to treat her ear) twice a day for 7 days which will cause any area with precancer change to get red, crusty and irritated and then heal up over 1-2 weeks.  MAs please call. Thank you!

## 2020-10-02 NOTE — Telephone Encounter (Signed)
Patient advised of BX results. Will use topical cream to area.

## 2020-10-07 NOTE — Addendum Note (Signed)
Addended by: Alfonso Patten on: 10/07/2020 03:51 PM   Modules accepted: Level of Service

## 2020-12-18 ENCOUNTER — Ambulatory Visit (INDEPENDENT_AMBULATORY_CARE_PROVIDER_SITE_OTHER): Payer: BC Managed Care – PPO | Admitting: Dermatology

## 2020-12-18 ENCOUNTER — Other Ambulatory Visit: Payer: Self-pay

## 2020-12-18 DIAGNOSIS — S1086XA Insect bite of other specified part of neck, initial encounter: Secondary | ICD-10-CM | POA: Diagnosis not present

## 2020-12-18 DIAGNOSIS — L570 Actinic keratosis: Secondary | ICD-10-CM

## 2020-12-18 DIAGNOSIS — L821 Other seborrheic keratosis: Secondary | ICD-10-CM

## 2020-12-18 DIAGNOSIS — W57XXXA Bitten or stung by nonvenomous insect and other nonvenomous arthropods, initial encounter: Secondary | ICD-10-CM

## 2020-12-18 NOTE — Progress Notes (Signed)
   Follow-Up Visit   Subjective  Kathleen Hall is a 50 y.o. female who presents for the following: Follow-up (Patient here for 2 month follow up on ak at left helix. Patient was prescribed calcipotriene to use at area. Patient states that it has gotten smaller. Patient reports a dark spot at nose in the last week she would like checked. Patient also has a itchy red spot right clavicle she would like checked. States she thinks may be a bug bite. ).  The following portions of the chart were reviewed this encounter and updated as appropriate:  Tobacco  Allergies  Meds  Problems  Med Hx  Surg Hx  Fam Hx       Objective  Well appearing patient in no apparent distress; mood and affect are within normal limits.  A focused examination was performed including left ear, chest, right neck/ clavicle . Relevant physical exam findings are noted in the Assessment and Plan.  right side neck Erythematous papule  Left Mid Helix Erythematous thin papules/macules with gritty scale.   Assessment & Plan  Bug bite without infection, initial encounter right side neck  Favor bug bite   Topical steroid sample Halog given to patient to use thin layer 2 - 3 times daily. Avoid applying to face, groin, and axilla. Use as directed. Risk of skin atrophy with long-term use reviewed.   Exp 12/22   lot # [email or web address]  Actinic keratosis Left Mid Helix  Actinic keratoses are precancerous spots that appear secondary to cumulative UV radiation exposure/sun exposure over time. They are chronic with expected duration over 1 year. A portion of actinic keratoses will progress to squamous cell carcinoma of the skin. It is not possible to reliably predict which spots will progress to skin cancer and so treatment is recommended to prevent development of skin cancer.  Recommend daily broad spectrum sunscreen SPF 30+ to sun-exposed areas, reapply every 2 hours as needed.  Recommend staying in the shade or wearing long  sleeves, sun glasses (UVA+UVB protection) and wide brim hats (4-inch brim around the entire circumference of the hat). Call for new or changing lesions.  Start 5-fluorouracil/calcipotriene cream twice a day for 7-10 days to affected areas until red and crusty. If not red and crusty by 10 days stop.   If spot is still not responsive will recheck in 3 month will consider LN2 or biopsy  Related Medications fluorouracil (EFUDEX) 5 % cream Apply topically 2 (two) times daily. Use for 7 days. Apply to affected areas of left ear  Seborrheic Keratoses - Stuck-on, waxy, tan-brown papules and/or plaques at nose  - Benign-appearing - Discussed benign etiology and prognosis. - Observe - Call for any changes  Return in about 3 months (around 03/20/2021) for ak followup. I, Ruthell Rummage, CMA, am acting as scribe for Forest Gleason, MD.  Documentation: I have reviewed the above documentation for accuracy and completeness, and I agree with the above.  Forest Gleason, MD

## 2020-12-18 NOTE — Patient Instructions (Addendum)
Use 5-Fluorouracil/Calcipotriene Patient Education   Actinic keratoses are the dry, red scaly spots on the skin caused by sun damage. A portion of these spots can turn into skin cancer with time, and treating them can help prevent development of skin cancer.   Treatment of these spots requires removal of the defective skin cells. There are various ways to remove actinic keratoses, including freezing with liquid nitrogen, treatment with creams, or treatment with a blue light procedure in the office.   5-fluorouracil cream is a topical cream used to treat actinic keratoses. It works by interfering with the growth of abnormal fast-growing skin cells, such as actinic keratoses. These cells peel off and are replaced by healthy ones.   5-fluorouracil/calcipotriene is a combination of the 5-fluorouracil cream with a vitamin D analog cream called calcipotriene. The calcipotriene alone does not treat actinic keratoses. However, when it is combined with 5-fluorouracil, it helps the 5-fluorouracil treat the actinic keratoses much faster so that the same results can be achieved with a much shorter treatment time.  INSTRUCTIONS FOR 5-FLUOROURACIL/CALCIPOTRIENE CREAM:   5-fluorouracil/calcipotriene cream typically only needs to be used for 4-7 days. A thin layer should be applied twice a day to the treatment areas recommended by your physician.   If your physician prescribed you separate tubes of 5-fluourouracil and calcipotriene, apply a thin layer of 5-fluorouracil followed by a thin layer of calcipotriene.   Avoid contact with your eyes, nostrils, and mouth. Do not use 5-fluorouracil/calcipotriene cream on infected or open wounds.   You will develop redness, irritation and some crusting at areas where you have pre-cancer damage/actinic keratoses. IF YOU DEVELOP PAIN, BLEEDING, OR SIGNIFICANT CRUSTING, STOP THE TREATMENT EARLY - you have already gotten a good response and the actinic keratoses should clear up  well.  Wash your hands after applying 5-fluorouracil 5% cream on your skin.   A moisturizer or sunscreen with a minimum SPF 30 should be applied each morning.   Once you have finished the treatment, you can apply a thin layer of Vaseline twice a day to irritated areas to soothe and calm the areas more quickly. If you experience significant discomfort, contact your physician.  For some patients it is necessary to repeat the treatment for best results.  SIDE EFFECTS: When using 5-fluorouracil/calcipotriene cream, you may have mild irritation, such as redness, dryness, swelling, or a mild burning sensation. This usually resolves within 2 weeks. The more actinic keratoses you have, the more redness and inflammation you can expect during treatment. Eye irritation has been reported rarely. If this occurs, please let us know.  If you have any trouble using this cream, please call the office. If you have any other questions about this information, please do not hesitate to ask me before you leave the office.     If you have any questions or concerns for your doctor, please call our main line at [phone] and press option 4 to reach your doctor's medical assistant. If no one answers, please leave a voicemail as directed and we will return your call as soon as possible. Messages left after 4 pm will be answered the following business day.   You may also send [place] a message via [place]. We typically respond to MyChart messages within 1-2 business days.  For prescription refills, please ask your pharmacy to contact our office. Our fax number is [phone].  If you have an urgent issue when the clinic is closed that cannot wait until the next business day, you can  page your doctor at the number below.    Please note that while we do our best to be available for urgent issues outside of office hours, we are not available 24/7.   If you have an urgent issue and are unable to reach [place], you may choose to  seek medical care at your doctor's office, retail clinic, urgent care center, or emergency room.  If you have a medical emergency, please immediately call 911 or go to the emergency department.  Pager Numbers  - Dr. Nehemiah Massed: [phone]  - Dr. Laurence Ferrari: [phone]  - Dr. Nicole Kindred: [phone]  In the event of inclement weather, please call our main line at [phone] for an update on the status of any delays or closures.  Dermatology Medication Tips: Please keep the boxes that topical medications come in in order to help keep track of the instructions about where and how to use these. Pharmacies typically print the medication instructions only on the boxes and not directly on the medication tubes.   If your medication is too expensive, please contact our office at [phone] option 4 or send [place] a message through [place].   We are unable to tell what your co-pay for medications will be in advance as this is different depending on your insurance coverage. However, we may be able to find a substitute medication at lower cost or fill out paperwork to get insurance to cover a needed medication.   If a prior authorization is required to get your medication covered by your insurance company, please allow [place] 1-2 business days to complete this process.  Drug prices often vary depending on where the prescription is filled and some pharmacies may offer cheaper prices.  The website www.goodrx.com contains coupons for medications through different pharmacies. The prices here do not account for what the cost may be with help from insurance (it may be cheaper with your insurance), but the website can give you the price if you did not use any insurance.  - You can print the associated coupon and take it with your prescription to the pharmacy.  - You may also stop by our office during regular business hours and pick up a GoodRx coupon card.  - If you need your prescription sent electronically to a  different pharmacy, notify our office through [facility] or by phone at [phone] option 4.

## 2020-12-28 ENCOUNTER — Encounter: Payer: Self-pay | Admitting: Dermatology

## 2021-03-12 ENCOUNTER — Ambulatory Visit (INDEPENDENT_AMBULATORY_CARE_PROVIDER_SITE_OTHER): Payer: BC Managed Care – PPO | Admitting: Dermatology

## 2021-03-12 ENCOUNTER — Encounter: Payer: Self-pay | Admitting: Dermatology

## 2021-03-12 ENCOUNTER — Other Ambulatory Visit: Payer: Self-pay

## 2021-03-12 DIAGNOSIS — L821 Other seborrheic keratosis: Secondary | ICD-10-CM

## 2021-03-12 DIAGNOSIS — T148XXA Other injury of unspecified body region, initial encounter: Secondary | ICD-10-CM | POA: Diagnosis not present

## 2021-03-12 DIAGNOSIS — Z872 Personal history of diseases of the skin and subcutaneous tissue: Secondary | ICD-10-CM

## 2021-03-12 DIAGNOSIS — L578 Other skin changes due to chronic exposure to nonionizing radiation: Secondary | ICD-10-CM | POA: Diagnosis not present

## 2021-03-12 MED ORDER — MUPIROCIN 2 % EX OINT: 1.0000 "application " | TOPICAL_OINTMENT | Freq: Every day | CUTANEOUS | 0 refills | Status: AC

## 2021-03-12 NOTE — Patient Instructions (Addendum)
[facility] Hypochlorous spray  Recommend taking Heliocare sun protection supplement daily in sunny weather for additional sun protection. For maximum protection on the sunniest days, you can take up to 2 capsules of regular Heliocare OR take 1 capsule of Heliocare Ultra. For prolonged exposure (such as a full day in the sun), you can repeat your dose of the supplement 4 hours after your first dose. Heliocare can be purchased at [facility] or at [email or web address].    Recommend daily broad spectrum sunscreen SPF 30+ to sun-exposed areas, reapply every 2 hours as needed. Call for new or changing lesions.  Staying in the shade or wearing long sleeves, sun glasses (UVA+UVB protection) and wide brim hats (4-inch brim around the entire circumference of the hat) are also recommended for sun protection.   If you have any questions or concerns for your doctor, please call our main line at [phone] and press option 4 to reach your doctor's medical assistant. If no one answers, please leave a voicemail as directed and we will return your call as soon as possible. Messages left after 4 pm will be answered the following business day.   You may also send [place] a message via [place]. We typically respond to MyChart messages within 1-2 business days.  For prescription refills, please ask your pharmacy to contact our office. Our fax number is [phone].  If you have an urgent issue when the clinic is closed that cannot wait until the next business day, you can page your doctor at the number below.    Please note that while we do our best to be available for urgent issues outside of office hours, we are not available 24/7.   If you have an urgent issue and are unable to reach [place], you may choose to seek medical care at your doctor's office, retail clinic, urgent care center, or emergency room.  If you have a medical emergency, please immediately call 911 or go to the emergency department.  Pager  Numbers  - Dr. Nehemiah Massed: [phone]  - Dr. Laurence Ferrari: [phone]  - Dr. Nicole Kindred: [phone]  In the event of inclement weather, please call our main line at [phone] for an update on the status of any delays or closures.  Dermatology Medication Tips: Please keep the boxes that topical medications come in in order to help keep track of the instructions about where and how to use these. Pharmacies typically print the medication instructions only on the boxes and not directly on the medication tubes.   If your medication is too expensive, please contact our office at [phone] option 4 or send [place] a message through [place].   We are unable to tell what your co-pay for medications will be in advance as this is different depending on your insurance coverage. However, we may be able to find a substitute medication at lower cost or fill out paperwork to get insurance to cover a needed medication.   If a prior authorization is required to get your medication covered by your insurance company, please allow [place] 1-2 business days to complete this process.  Drug prices often vary depending on where the prescription is filled and some pharmacies may offer cheaper prices.  The website www.goodrx.com contains coupons for medications through different pharmacies. The prices here do not account for what the cost may be with help from insurance (it may be cheaper with your insurance), but the website can give you the price if you did not use any insurance.  - You  can print the associated coupon and take it with your prescription to the pharmacy.  - You may also stop by our office during regular business hours and pick up a GoodRx coupon card.  - If you need your prescription sent electronically to a different pharmacy, notify our office through [facility] MyChart or by phone at [phone] option 4.  

## 2021-03-12 NOTE — Progress Notes (Signed)
   Follow-Up Visit   Subjective  Kathleen Hall is a 50 y.o. female who presents for the following: Follow-up (Patient here today for 3 month AK follow up at left mid helix. Patient used 56fu/calcipotriene to treat area, advises it did get red and crusty. ).  Patient does have a question about a piercing at the left ear she had done recently.   The following portions of the chart were reviewed this encounter and updated as appropriate:   Tobacco  Allergies  Meds  Problems  Med Hx  Surg Hx  Fam Hx      Review of Systems:  No other skin or systemic complaints except as noted in HPI or Assessment and Plan.  Objective  Well appearing patient in no apparent distress; mood and affect are within normal limits.  A focused examination was performed including face, chest, ears, back. Relevant physical exam findings are noted in the Assessment and Plan.  Left Ear Tender crusted edematous eroded papule at piercing site   Assessment & Plan  Open wound Left Ear  C/w local wound infection  Start mupirocin ointment daily Remove earring - clean earring and ear with alcohol or hypochlorous wound cleanser, then apply mupirocin to skin and to earring before inserting the earring in the piercing again.  Call for worsening or if not improving within 1 week  mupirocin ointment (BACTROBAN) 2 % - Left Ear Apply 1 application topically daily. With dressing changes  Related Procedures Anaerobic and Aerobic Culture  History of PreCancerous Actinic Keratosis  - site(s) of PreCancerous Actinic Keratosis clear today. - these may recur and new lesions may form requiring treatment to prevent transformation into skin cancer - observe for new or changing spots and contact [place] for appointment if occur - photoprotection with sun protective clothing; sunglasses and broad spectrum sunscreen with SPF of at least 30 + and frequent self skin exams recommended - yearly exams by a  dermatologist recommended for persons with history of PreCancerous Actinic Keratoses  Actinic Damage - chronic, secondary to cumulative UV radiation exposure/sun exposure over time - diffuse scaly erythematous macules with underlying dyspigmentation - Recommend daily broad spectrum sunscreen SPF 30+ to sun-exposed areas, reapply every 2 hours as needed.  - Recommend staying in the shade or wearing long sleeves, sun glasses (UVA+UVB protection) and wide brim hats (4-inch brim around the entire circumference of the hat). - Call for new or changing lesions. - Recommend taking Heliocare sun protection supplement daily in sunny weather for additional sun protection. For maximum protection on the sunniest days, you can take up to 2 capsules of regular Heliocare OR take 1 capsule of Heliocare Ultra. For prolonged exposure (such as a full day in the sun), you can repeat your dose of the supplement 4 hours after your first dose.   Seborrheic Keratoses - Stuck-on, waxy, tan-brown papules and/or plaques  - Benign-appearing - Discussed benign etiology and prognosis. - Observe - Call for any changes  Return in about 1 year (around 03/12/2022) for TBSE.  Graciella Belton, RMA, am acting as scribe for Forest Gleason, MD .   Documentation: I have reviewed the above documentation for accuracy and completeness, and I agree with the above.  Forest Gleason, MD

## 2021-03-23 LAB — ANAEROBIC AND AEROBIC CULTURE

## 2021-03-24 ENCOUNTER — Telehealth: Payer: Self-pay

## 2021-03-24 NOTE — Telephone Encounter (Addendum)
Tried calling patient regarding culture results. No answer. Left message for patient to return call to office.     ----- Message from Alfonso Patten, MD sent at 03/24/2021 10:27 AM EDT ----- Culture from ear grew Corynebacterium species which are usually susceptible to mupirocin ointment which patient is already  using. Please confirm patient is doing well and let me know if she is still having trouble with the site.

## 2021-03-25 NOTE — Telephone Encounter (Addendum)
Called patient regarding culture results.  Left message for patient to call office. ----- Message from Alfonso Patten, MD sent at 03/24/2021 10:27 AM EDT ----- Culture from ear grew Corynebacterium species which are usually susceptible to mupirocin ointment which patient is already  using. Please confirm patient is doing well and let me know if she is still having trouble with the site.

## 2021-03-30 ENCOUNTER — Telehealth: Payer: Self-pay

## 2021-03-30 NOTE — Telephone Encounter (Addendum)
Tried calling patient about results. No answer. Left message for patient to call office.    ----- Message from Alfonso Patten, MD sent at 03/24/2021 10:27 AM EDT ----- Culture from ear grew Corynebacterium species which are usually susceptible to mupirocin ointment which patient is already  using. Please confirm patient is doing well and let me know if she is still having trouble with the site.

## 2021-03-31 ENCOUNTER — Telehealth: Payer: Self-pay

## 2021-03-31 NOTE — Telephone Encounter (Signed)
-----   Message from Alfonso Patten, MD sent at 03/24/2021 10:27 AM EDT ----- Culture from ear grew Corynebacterium species which are usually susceptible to mupirocin ointment which patient is already  using. Please confirm patient is doing well and let me know if she is still having trouble with the site.

## 2021-03-31 NOTE — Telephone Encounter (Signed)
Patient has been advised of culture results.

## 2021-08-14 ENCOUNTER — Other Ambulatory Visit: Payer: Self-pay | Admitting: Physician Assistant

## 2021-08-14 DIAGNOSIS — Z1231 Encounter for screening mammogram for malignant neoplasm of breast: Secondary | ICD-10-CM

## 2021-08-14 DIAGNOSIS — R921 Mammographic calcification found on diagnostic imaging of breast: Secondary | ICD-10-CM

## 2021-09-21 ENCOUNTER — Ambulatory Visit
Admission: RE | Admit: 2021-09-21 | Discharge: 2021-09-21 | Disposition: A | Discharge status: Home / Self Care | Payer: BC Managed Care – PPO | Source: Ambulatory Visit | Attending: Physician Assistant | Admitting: Physician Assistant

## 2021-09-21 DIAGNOSIS — Z1231 Encounter for screening mammogram for malignant neoplasm of breast: Secondary | ICD-10-CM | POA: Diagnosis present

## 2021-09-21 DIAGNOSIS — R921 Mammographic calcification found on diagnostic imaging of breast: Secondary | ICD-10-CM | POA: Diagnosis not present

## 2022-08-18 ENCOUNTER — Other Ambulatory Visit: Payer: Self-pay | Admitting: Physician Assistant

## 2022-08-18 DIAGNOSIS — R9230 Dense breasts, unspecified: Secondary | ICD-10-CM

## 2022-08-27 IMAGING — MG DIGITAL DIAGNOSTIC BILAT W/ TOMO W/ CAD
8 of 12 series · 9 of 32 positions shown · non-contrast
Comparison: Prior films

CLINICAL DATA: Short-term follow-up right breast calcifications

EXAM:
DIGITAL DIAGNOSTIC BILATERAL MAMMOGRAM WITH TOMOSYNTHESIS AND CAD
TECHNIQUE: Bilateral digital diagnostic mammography and breast tomosynthesis
was performed. The images were evaluated with computer-aided
detection.

[R LM]
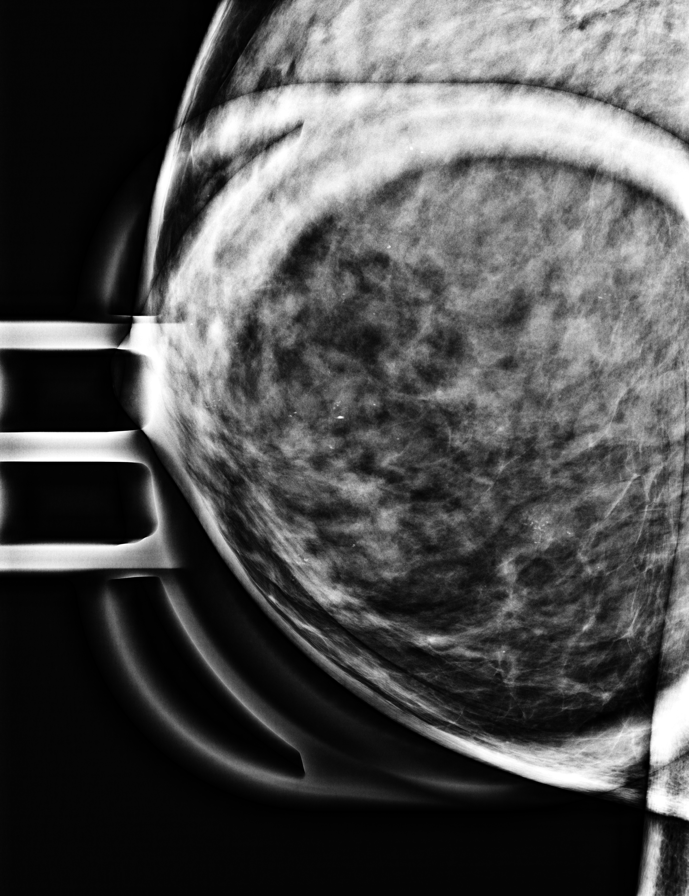

[R CC]
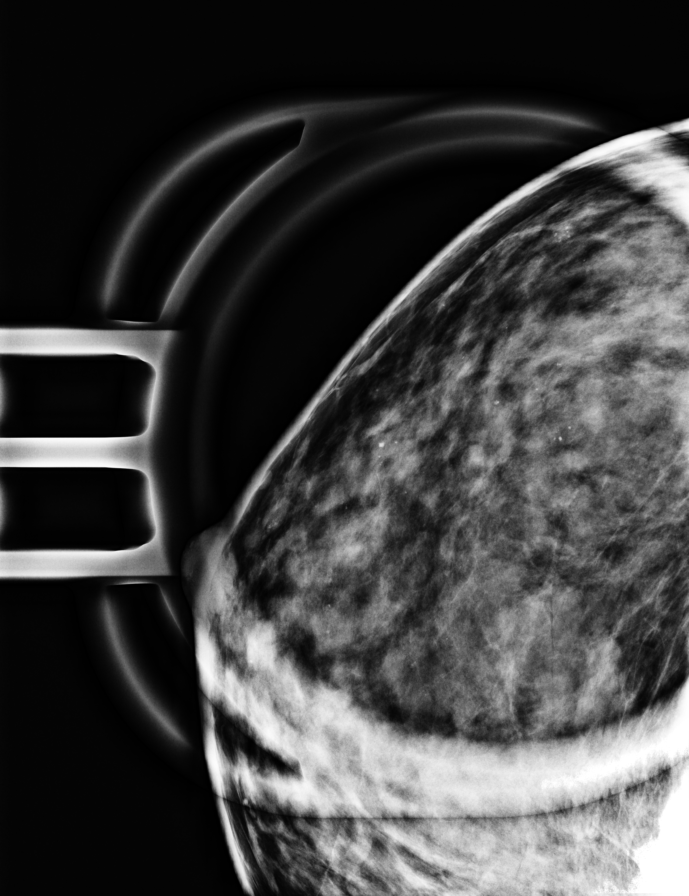

[L MLO synth-2D]
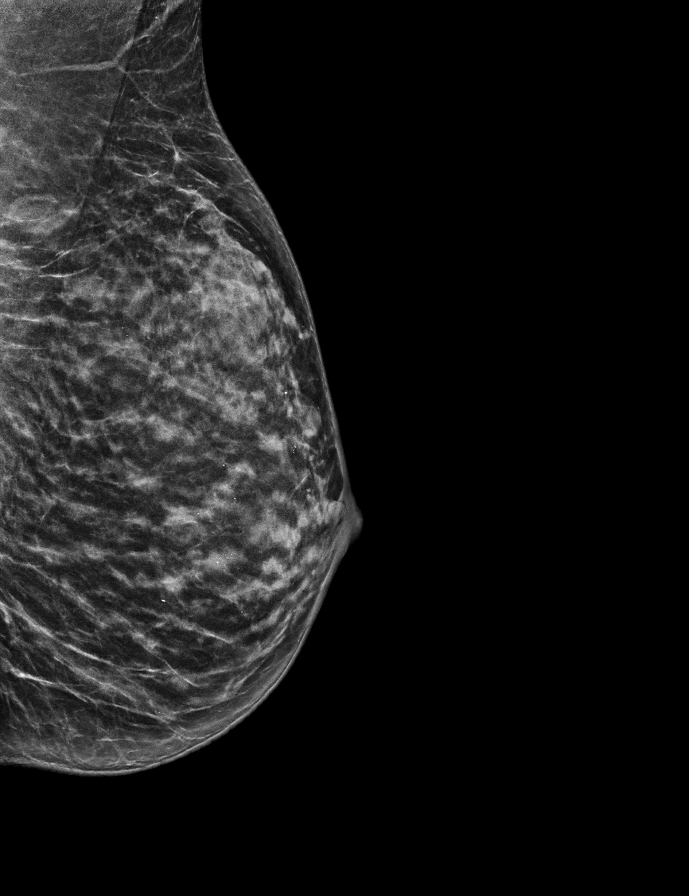

[R CC synth-2D]
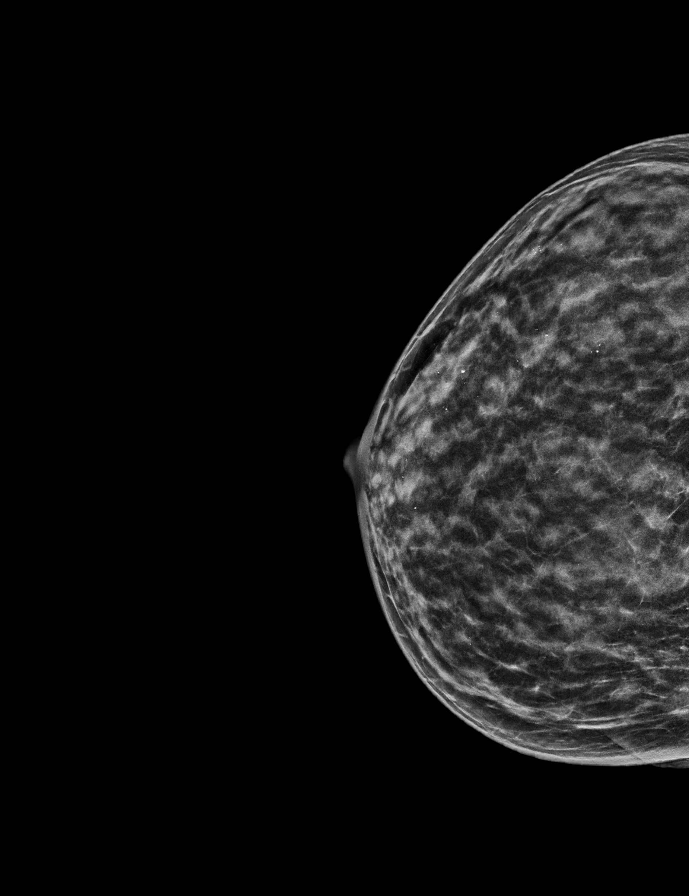

[L CC synth-2D]
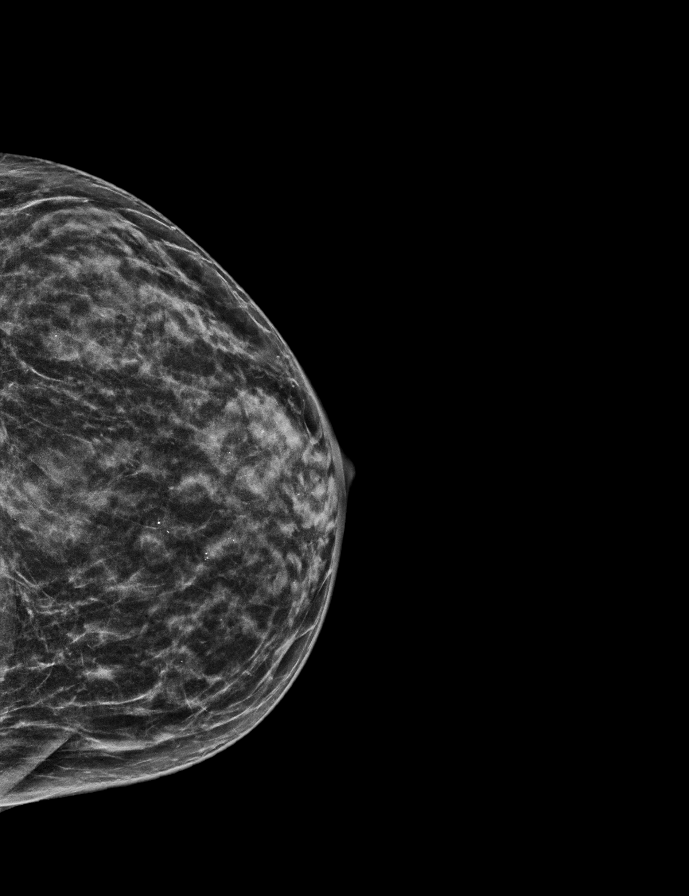

[R MLO synth-2D (1 of 2)]
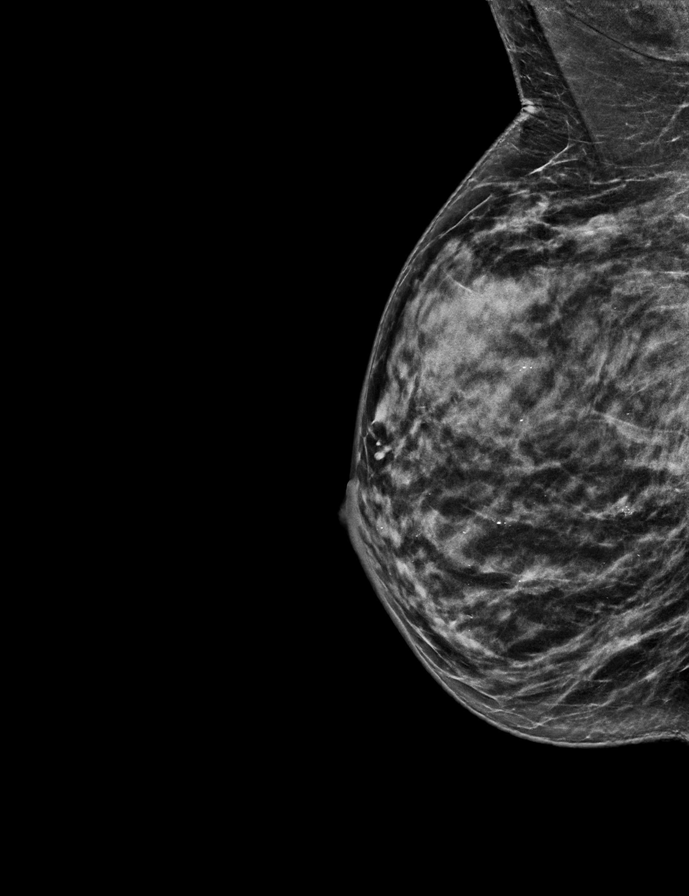

[R MLO synth-2D (2 of 2)]
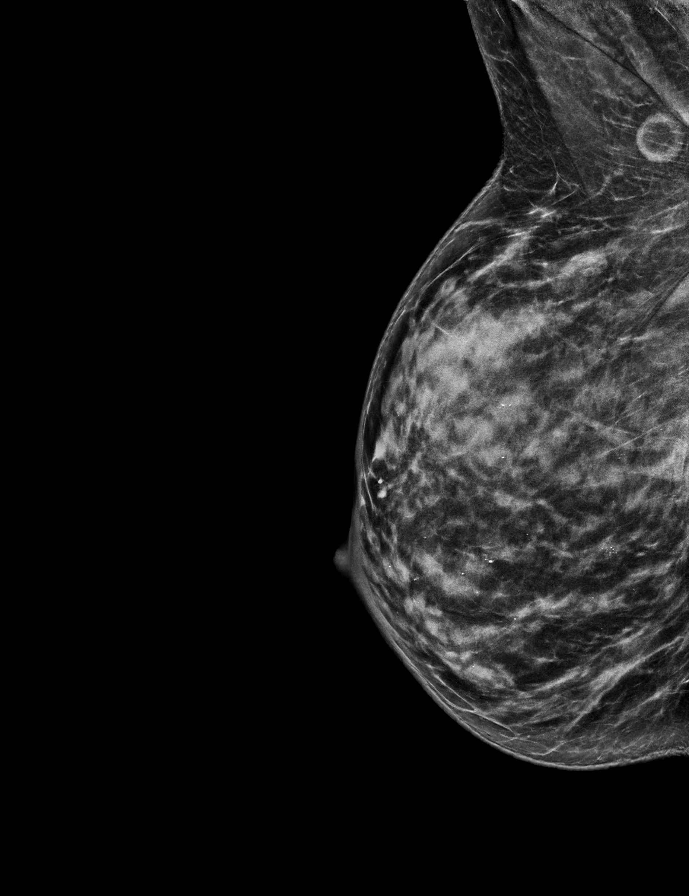

[R CC tomo · 2 of 50 frames shown]
[frame 17/50]
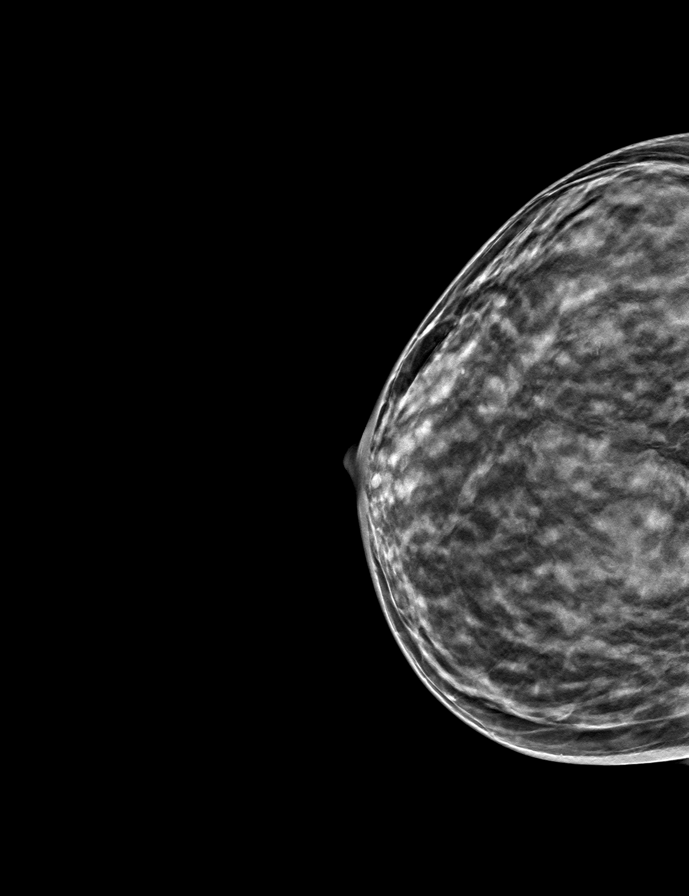
[frame 25/50]
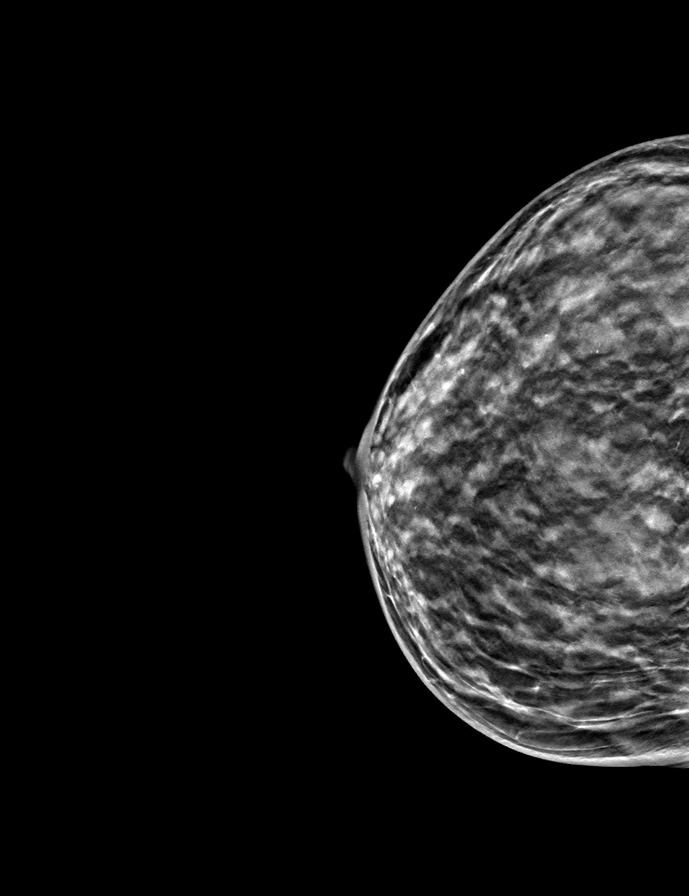

[9 of 32 positions shown; findings below may reference images not displayed]

ACR Breast Density Category d: The breast tissue is extremely dense,
which lowers the sensitivity of mammography.
FINDINGS: Cc and MLO views of bilateral breasts, spot magnification cc and
lateral views of the right breast are submitted. Stable
calcifications are identified in the lower right breast unchanged.
No suspicious abnormalities identified in the left breast.
IMPRESSION: Probable benign findings.

RECOMMENDATION:
Bilateral diagnostic mammogram in 1 year.

I have discussed the findings and recommendations with the patient.
If applicable, a reminder letter will be sent to the patient
regarding the next appointment.

BI-RADS CATEGORY  3: Probably benign.

## 2022-10-05 ENCOUNTER — Ambulatory Visit
Admission: RE | Admit: 2022-10-05 | Discharge: 2022-10-05 | Disposition: A | Discharge status: Home / Self Care | Payer: BC Managed Care – PPO | Source: Ambulatory Visit | Attending: Physician Assistant | Admitting: Physician Assistant

## 2022-10-05 DIAGNOSIS — R9230 Dense breasts, unspecified: Secondary | ICD-10-CM

## 2023-08-01 ENCOUNTER — Other Ambulatory Visit: Payer: Self-pay | Admitting: Family

## 2023-08-03 LAB — SURGICAL PATHOLOGY

## 2023-08-29 ENCOUNTER — Other Ambulatory Visit: Payer: Self-pay | Admitting: Physician Assistant

## 2023-08-29 DIAGNOSIS — Z1231 Encounter for screening mammogram for malignant neoplasm of breast: Secondary | ICD-10-CM
